# Patient Record
Sex: Female | Born: 1975
Health system: Southern US, Community
[De-identification: ages and names within clinical notes are randomized; demographics above are authoritative.]

## PROBLEM LIST (undated history)

## (undated) DIAGNOSIS — R928 Other abnormal and inconclusive findings on diagnostic imaging of breast: Secondary | ICD-10-CM

## (undated) DIAGNOSIS — F419 Anxiety disorder, unspecified: Secondary | ICD-10-CM

## (undated) DIAGNOSIS — J45909 Unspecified asthma, uncomplicated: Secondary | ICD-10-CM

## (undated) DIAGNOSIS — I471 Supraventricular tachycardia, unspecified: Secondary | ICD-10-CM

## (undated) DIAGNOSIS — L03019 Cellulitis of unspecified finger: Secondary | ICD-10-CM

## (undated) DIAGNOSIS — J301 Allergic rhinitis due to pollen: Secondary | ICD-10-CM

## (undated) HISTORY — PX: TONSILLECTOMY: SUR1361

## (undated) HISTORY — DX: Allergic rhinitis due to pollen: J30.1

## (undated) HISTORY — DX: Anxiety disorder, unspecified: F41.9

## (undated) HISTORY — DX: Supraventricular tachycardia: I47.1

## (undated) HISTORY — DX: Cellulitis of unspecified finger: L03.019

## (undated) HISTORY — DX: Unspecified asthma, uncomplicated: J45.909

## (undated) HISTORY — PX: AUGMENTATION MAMMAPLASTY: SUR837

## (undated) HISTORY — DX: Supraventricular tachycardia, unspecified: I47.10

## (undated) HISTORY — PX: DILATION AND CURETTAGE OF UTERUS: SHX78

## (undated) HISTORY — DX: Other abnormal and inconclusive findings on diagnostic imaging of breast: R92.8

---

## 2002-07-28 ENCOUNTER — Other Ambulatory Visit: Admission: RE | Admit: 2002-07-28 | Discharge: 2002-07-28 | Payer: Self-pay | Admitting: Obstetrics and Gynecology

## 2002-10-28 ENCOUNTER — Inpatient Hospital Stay (HOSPITAL_COMMUNITY): Admission: AD | Admit: 2002-10-28 | Discharge: 2002-10-28 | Payer: Self-pay | Admitting: Obstetrics and Gynecology

## 2002-10-29 ENCOUNTER — Inpatient Hospital Stay (HOSPITAL_COMMUNITY): Admission: AD | Admit: 2002-10-29 | Discharge: 2002-10-29 | Payer: Self-pay | Admitting: Obstetrics and Gynecology

## 2002-12-16 ENCOUNTER — Ambulatory Visit (HOSPITAL_COMMUNITY): Admission: RE | Admit: 2002-12-16 | Discharge: 2002-12-16 | Payer: Self-pay | Admitting: Obstetrics and Gynecology

## 2002-12-16 ENCOUNTER — Encounter: Payer: Self-pay | Admitting: Obstetrics and Gynecology

## 2003-01-04 ENCOUNTER — Encounter (INDEPENDENT_AMBULATORY_CARE_PROVIDER_SITE_OTHER): Payer: Self-pay | Admitting: Specialist

## 2003-01-04 ENCOUNTER — Inpatient Hospital Stay (HOSPITAL_COMMUNITY): Admission: AD | Admit: 2003-01-04 | Discharge: 2003-01-06 | Payer: Self-pay | Admitting: Obstetrics and Gynecology

## 2003-01-18 ENCOUNTER — Ambulatory Visit (HOSPITAL_COMMUNITY): Admission: AD | Admit: 2003-01-18 | Discharge: 2003-01-18 | Payer: Self-pay | Admitting: Obstetrics and Gynecology

## 2004-05-17 ENCOUNTER — Other Ambulatory Visit: Admission: RE | Admit: 2004-05-17 | Discharge: 2004-05-17 | Payer: Self-pay | Admitting: Obstetrics and Gynecology

## 2007-06-22 ENCOUNTER — Emergency Department (HOSPITAL_COMMUNITY): Admission: EM | Admit: 2007-06-22 | Discharge: 2007-06-22 | Payer: Self-pay | Admitting: Emergency Medicine

## 2007-06-24 ENCOUNTER — Emergency Department (HOSPITAL_COMMUNITY): Admission: EM | Admit: 2007-06-24 | Discharge: 2007-06-24 | Payer: Self-pay | Admitting: Emergency Medicine

## 2007-06-26 ENCOUNTER — Ambulatory Visit: Payer: Self-pay | Admitting: Cardiology

## 2007-06-26 LAB — CONVERTED CEMR LAB: TSH: 1.1 microintl units/mL (ref 0.35–5.50)

## 2007-07-02 ENCOUNTER — Encounter: Payer: Self-pay | Admitting: Cardiology

## 2007-07-02 ENCOUNTER — Ambulatory Visit: Payer: Self-pay

## 2007-08-04 ENCOUNTER — Ambulatory Visit: Payer: Self-pay | Admitting: Cardiology

## 2008-07-03 DIAGNOSIS — R002 Palpitations: Secondary | ICD-10-CM | POA: Insufficient documentation

## 2008-07-08 ENCOUNTER — Telehealth: Payer: Self-pay | Admitting: Cardiology

## 2008-07-14 ENCOUNTER — Ambulatory Visit: Payer: Self-pay | Admitting: Cardiology

## 2009-02-24 ENCOUNTER — Telehealth: Payer: Self-pay | Admitting: Cardiology

## 2009-07-21 ENCOUNTER — Ambulatory Visit: Payer: Self-pay | Admitting: Cardiology

## 2010-04-04 NOTE — Assessment & Plan Note (Signed)
Summary: 1 yr rov for palps  fph   Visit Type:  Follow-up  CC:  Palpiations.  History of Present Illness: The patient presents for followup of palpitations. In the past year with her beta blocker she has done well. He is not noticing any palpitations on a regular basis. She is not having any presyncope or syncope. She is not having any chest pressure, neck or arm discomfort.  Once while traveling in [place] she did forget her metoprolol and at night had significant palpitations. She did take her beta blocker and things improved. She's been running and doing other exercises without limitations.  Current Medications (verified): 1)  Metoprolol Succinate 25 Mg Xr24h-Tab (Metoprolol Succinate) .... 2 By Mouth Daily 2)  Claritin 10 Mg Tabs (Loratadine) .... As Needed  Allergies (verified): No Known Drug Allergies  Past History:  Past Medical History: Reviewed history from 07/03/2008 and no changes required. Tachypalpitations  Past Surgical History: Reviewed history from 07/03/2008 and no changes required.  D&C, tonsillectomy.   Review of Systems       As stated in the HPI and negative for all other systems.   Vital Signs:  Patient profile:   35 year old female Height:      62 inches Weight:      108 pounds BMI:     19.82 Pulse rate:   77 / minute Resp:     16 per minute BP sitting:   104 / 62  (right arm)  Vitals Entered By: Marrion Coy, CNA (Jul 21, 2009 3:05 PM)  Physical Exam  General:  Well developed, well nourished, in no acute distress. Head:  normocephalic and atraumatic Neck:  Neck supple, no JVD. No masses, thyromegaly or abnormal cervical nodes. Chest Wall:  no deformities or breast masses noted Lungs:  Clear bilaterally to auscultation and percussion. Heart:  Non-displaced PMI, chest non-tender; regular rate and rhythm, S1, S2 without murmurs, rubs or gallops. Carotid upstroke normal, no bruit. Normal abdominal aortic size, no bruits. Femorals normal  pulses, no bruits. Pedals normal pulses. No edema, no varicosities. Abdomen:  Bowel sounds positive; abdomen soft and non-tender without masses, organomegaly, or hernias noted. No hepatosplenomegaly. Msk:  Back normal, normal gait. Muscle strength and tone normal. Extremities:  No clubbing or cyanosis. Neurologic:  Alert and oriented x 3. Skin:  Intact without lesions or rashes. Psych:  Normal affect.   EKG  Procedure date:  07/21/2009  Findings:      sinus rhythm, rate 77, RSR prime V1 and V2, no acute ST-T wave changes  Impression & Recommendations:  Problem # 1:  PALPITATIONS (ICD-785.1) We again discussed the patient's palpitations. She doesn't have these if she takes her beta blocker. She otherwise is doing well. She's had a complete evaluation including labs and an echocardiogram. No further cardiovascular testing is suggested. She does want yearly followup and I will arrange this. Orders: EKG w/ Interpretation (93000)  Patient Instructions: 1)  Your physician recommends that you schedule a follow-up appointment in: 1 yr with Dr Hurricane Lions 2)  Your physician recommends that you continue on your current medications as directed. Please refer to the Current Medication list given to you today.

## 2010-07-18 NOTE — Consult Note (Signed)
Crystal, Friedman             ACCOUNT NO.:  [id number]   MEDICAL RECORD NO.:  [id number]          PATIENT TYPE:  EMS   LOCATION:  [facility]                         FACILITY:  [facility]   PHYSICIAN:  Vivaan Helseth Dictator       DATE OF BIRTH:  06/02/1975   DATE OF CONSULTATION:  06/24/2007  DATE OF DISCHARGE:                                 CONSULTATION   PRIMARY CARE:  Is Dr. Daphane Shepherd with [facility].   No cardiologist at this time.   Crystal Friedman presents to [facility] emergency room on this date by EMS  complaining of palpitations with chest pain.  She is a very pleasant, 35-  year-old, Caucasian female with no known medical history who had an  episode of palpitations on Sunday and was actually seen at [facility].  She was started on Toprol XL 25 mg for supraventricular tachycardia and  apparently was arranged for an appointment at our office for April 30.  Since Sunday, the patient has had four more episodes of rapid heart rate  associated with chest discomfort.  She states it feels like an elephant  on her chest and a tight band wrapped around her chest, and it makes her  very anxious.  She is taking her Toprol as prescribed.  She has tried  Valsalva maneuver without success.  No caffeine.  Has not taken any over-  the-counter medications.  The only medication she is on is Claritin, and  it is the Claritin without the decongestant.  Today, she experienced  palpitations again, was associated with presyncope.  EMS was called.  First responders documented her heart rate as 168.  EMS 12-lead EKG  showing sinus tachycardia.  By the time the patient was on the heart  monitor, the rate has slowed down.  Crystal Friedman denies any chest  discomfort currently.  However, her heart rate is around 100-110 at  rest.  Here in the ER, she was given 2 mg of Ativan, 2 mg of morphine,  and 4 mg of Zofran on initial arrival because of anxiety associated with  her rapid heart rate.  She is currently very groggy secondary  to the  sedation.   PAST MEDICAL HISTORY:  Includes seasonal allergies and x2 normal  pregnancies.   No known drug allergies.   MEDICATION:  Include plain Claritin.   SOCIAL HISTORY:  The patient lives in [place] with her husband and two  children.  She is very active.  Denies any drug or herbal medication  use.  No tobacco use.  States she has an occasional glass of wine maybe  four or five times a month.  Parents are alive and well without any  medical problems.   REVIEW OF SYSTEMS:  Positive for palpitations and chest discomfort as  described above.  Otherwise systems reviewed and negative.   PHYSICAL EXAMINATION:  The patient afebrile, blood pressure 102/64,  heart rate 96-110 currently, respirations 16.  The patient is very  groggy but in no acute distress.  HEENT:  Normal.  NECK:  Supple without lymphadenopathy, no bruits.  No JVD.  CARDIOVASCULAR EXAM:  Reveals  S1-S2, slightly tachycardic.  No murmurs,  rubs or gallops.  LUNGS:  Clear to auscultation bilaterally.  ABDOMEN:  Soft, nontender, positive bowel sounds.  SKIN:  Is warm and dry.  LOWER EXTREMITIES:  Without clubbing, cyanosis or edema.  NEUROLOGICALLY:  Oriented x3, groggy but appropriate.   Chest x-ray showing no evidence of acute cardiopulmonary disease.  Point  of cares:  Negative troponin x2.  Sodium 141, potassium 3.7, chloride  106, BUN 20, creatinine 0.9, hemoglobin 12.6, hematocrit 37.  Urine  pregnancy negative.  D-dimer 0.25.   IMPRESSION:  Palpitations with rapid heart rate, undocumented at this  time.  However, patient is sinus tachycardia at a rate of 100-110 on  telemetry here in the emergency room.  The patient with a previously  scheduled appointment at our office for April 30; will be rearranged to  be seen this week, also arrange for a Holter monitor to be placed on the  patient, and Toprol has been increased to 50 mg daily.  I have given her  a prescription for Ativan 0.5 mg 1 p.o. q.8h.  p.r.n.  The patient will  be called at home at [phone] by office staff tomorrow to have  appointment moved up and Holter monitor arranged.      Dorian Pod, ACNP    ______________________________  Merland Holness Dictator    MB/MEDQ  D:  06/24/2007  T:  06/24/2007  Job:  [id number]

## 2010-07-18 NOTE — Assessment & Plan Note (Signed)
[facility]                            CARDIOLOGY OFFICE NOTE   Crystal Friedman, Crystal Friedman                      MRN:          [id number]  DATE:06/26/2007                            DOB:          26-Jul-1975    PRIMARY:  None.   REASON FOR PRESENTATION:  Evaluate patient for tachycardia.   HISTORY OF PRESENT ILLNESS:  The patient presents for follow-up after  being seen by our group in consultation.  She had, had some tachy  palpitations.  She says she is actually been having these for several  weeks.  She thinks that she may have had about 15 or 20 episodes over  several weeks.  She had a severe episode on Sunday at rest.  It lasted  about 45 minutes.  It was at night.  She gets nauseated.  She has been  lightheaded with these in the past.  She did present to Urgent Care,  where apparently her heart rate was 182, but there was no EKG documented  that picked up this arrhythmia.  She was transferred to [facility].  She  was given some beta-blockers.  However, she had recurrence of this.  At  that time, she was taking 25 mg XL of Toprol.  She came to the ER on  Tuesday, where she was seen in consultation.  Interestingly, EMS had  documented her heart rate to be 168.  However, the first on the EKG  could be obtained, her heart rate was down in the 100-110 range and was  sinus tachycardia.  She was told to get an increased dose of her  metoprolol and has had no severe symptomatic palpitations since that  time.  She was also told about vagal maneuvers, but has not had to use  these.  She was told to follow-up in his office.   She has otherwise been a healthy 35 year old.  She denies any chest  pressure, neck or arm discomfort.  She has had no PND, orthopnea or  shortness of breath.  She does drink a little caffeine.  She does not  smoke cigarettes or drink alcohol.   PAST MEDICAL HISTORY:  Seasonal allergies, two normal pregnancies.  D&C,  tonsillectomy.   ALLERGIES:  NONE.   MEDICATIONS:  Metoprolol 50 mg daily, Bufferin, loratadine.   REVIEW OF SYSTEMS:  As stated in the HPI.  Positive for occasional  headaches, reflux, mild low back pain.  Negative for other systems.   PHYSICAL EXAMINATION:  GENERAL:  The patient is in no distress.  VITAL SIGNS:  Blood pressure 112/76, heart rate 96 and regular, weight  102 pounds.  HEENT:  Eyes unremarkable.  Pupils equal, round and reactive to light.  Fundi not visualized.  Oral mucosa unremarkable.  NECK:  No jugulovenous distention at 45 degrees.  Carotid upstroke brisk  and symmetrical.  No bruits, no thyromegaly.  LYMPHATICS:  No cervical, axillary or inguinal adenopathy.  LUNGS:  Clear to auscultation bilaterally.  BACK:  No costovertebral angle tenderness.  CHEST:  Unremarkable.  HEART:  PMI not displaced or sustained, S1-S2 within normal limits.  No  S3, no S4.  No clicks, rubs or murmurs.  ABDOMEN:  Flat, positive bowel sounds.  Normal in frequency and pitch.  No bruits, rebound or guarding.  No midline pulsatile mass.  No  hepatomegaly or splenomegaly.  SKIN:  No rashes, no nodules.  EXTREMITIES:  2+ pulses throughout.  No edema, cyanosis or clubbing.  NEURO:  Oriented to person, place and time.  Cranial nerves II-XII  grossly intact.  Motor grossly intact.   EKG sinus rhythm, rate 96, axis within normal limits.  Intervals were  normal.  No acute ST wave change.   ASSESSMENT/PLAN:  1. Tachy palpitations.  The patient sounds like she had a sustained      dysrhythmia.  This was not captured on a 12-lead or rhythm strip.      She has a normal physical examination.  I do not see a TSH.  We      will draw this.  I will have her on a 4-week event monitor.  She      will continue the beta-blocker.  If she is no more severe episodes,      then this will be the continued therapy.  Otherwise, we will look      for the mechanism and address how to treat this.  I will also check      an  echocardiogram.  2. Follow-up will be in 1 month or sooner if needed.  Of note, if the      patient has any sustained tachy palpitations, she is to call EMS.     Rollene Rotunda, MD, [facility]  Electronically Signed    JH/MedQ  DD: 06/26/2007  DT: 06/26/2007  Job #: [id number]

## 2010-07-18 NOTE — Assessment & Plan Note (Signed)
[facility]                            CARDIOLOGY OFFICE NOTE   Crystal Friedman, Crystal Friedman                      MRN:          [id number]  DATE:08/04/2007                            DOB:          Feb 24, 1976    PRIMARY:  None.   REASON FOR PRESENTATION:  Evaluate patient with palpitations.   HISTORY OF PRESENT ILLNESS:  The patient presents for follow-up of the  above.  She is 35 years old.  Her symptoms are outlined in the June 26, 2007, note.  She wore a 21-day event monitor.  She pressed the button  many times.  There were some rare premature atrial contractions but for  almost the entire part she had normal sinus rhythm when she would press  the monitor.  She has been taking a beta blocker.  She was fatigued with  this at first.  However, she persisted in taking it.  She says she is no  longer having any palpitations.  She says she feels well.  She feels  back to her normal self.  She has had no presyncope or syncope.  She has  had no chest pain or shortness of breath.   PAST MEDICAL HISTORY:  D&C, tonsillectomy.   ALLERGIES:  None.   MEDICATIONS:  Metoprolol 50 mg daily, Bufferin, loratadine.   REVIEW OF SYSTEMS:  As stated in HPI and otherwise negative for other  systems.   PHYSICAL EXAMINATION:  The patient is well-appearing and in no distress.  Blood pressure 100/70, heart rate 75 and regular, weight 102 pounds.  NECK:  No jugular distention at 45 degrees, carotid upstroke brisk and  symmetric.  No bruits, no thyromegaly.  LUNGS:  Clear to auscultation bilaterally.  HEART:  PMI not displaced or sustained.  S1 and S2 within normal limits.  No S3, no S4.  No clicks, rubs or murmurs.  ABDOMEN:  Flat, positive bowel sounds.  No rebound, no guarding.  EXTREMITIES:  2+ pulse, no edema.   EKG:  Sinus rhythm, rate 75, axis within normal limits, intervals within  normal limits, no acute ST-wave change.   ASSESSMENT AND PLAN:  1.  Tachypalpitations.  The patient is no longer having these symptoms.      She has done well with the beta blocker.  She prefers to continue      this.  She has had a normal echocardiogram.  She has had normal      TSH.  No further cardiac workup is planned at this      point.  She will call me if she has any recurrent symptoms.  2. Follow up as above.     Rollene Rotunda, MD, [facility]  Electronically Signed    JH/MedQ  DD: 08/04/2007  DT: 08/04/2007  Job #: [id number]

## 2010-07-21 NOTE — H&P (Signed)
NAMEJEWELZ, RICKLEFS NO.:  [id number]   MEDICAL RECORD NO.:  [id number]                   PATIENT TYPE:  MAT   LOCATION:  [facility]                                 FACILITY:  [facility]   PHYSICIAN:  Hal Morales, M.D.             DATE OF BIRTH:  11/18/1975   DATE OF ADMISSION:  01/18/2003  DATE OF DISCHARGE:                                HISTORY & PHYSICAL   HISTORY OF PRESENT ILLNESS:  [name] is a 35 year old, gravida 2, para 2-  0-0-2, who presents to the office at two weeks postpartum from a vaginal  delivery with an episode of heavy bleeding at home earlier today, followed  by the passage of questionable pieces of placenta.  Her vaginal delivery,  which took place on January 04, 2003, was essentially unremarkable, although  there was a velamentous insertion of the umbilical cord noted after the  placenta was delivered.  This was sent to pathology, and the only thing  abnormal noted on pathology was that they were incomplete membranes.   OBSTETRICAL HISTORY:  The patient is a gravida 2, para 2-0-0-2.  In August  of 2002, she vaginally delivered a female infant after five hours of labor.  He weighed 5 pounds.  This was at [redacted] weeks gestation.  She had an epidural  for anesthesia.  Her second delivery took place on January 04, 2003.  The  infant weighed 6 pounds and 3 ounces.  It was a viable female, and there are  no complications.   MEDICAL HISTORY:  She reports having had the usual childhood illnesses.  She  has a history of asthma, for which she has used an inhaler as needed.  She  has had cystitis one time.   ALLERGIES:  She has no medication allergies.   SURGICAL HISTORY:  Remarkable for a tonsillectomy at the age of [age].   GENETIC HISTORY:  Unremarkable.   SOCIAL HISTORY:  The patient is married to the father of the baby.  His name  is Judie Grieve.  He is involved and supportive, and they deny any alcohol,  tobacco, or illicit drug use with the  pregnancy.   OBJECTIVE DATA:  VITAL SIGNS:  Stable.  Temperature was 97.5, blood pressure  100/60.  Her hemoglobin was 10.6.  HEENT:  Grossly within normal limits.  CHEST:  Clear to auscultation.  HEART:  Regular rate and rhythm.  ABDOMEN:  Soft and nontender.  Uterus is at approximately 14 weeks size and  firm.  PELVIC:  Speculum exam shows membranes present at the cervical os.  Adnexa  are nontender.  EXTREMITIES:  Within normal limits.   Ultrasound is consistent with retained products.    ASSESSMENT:  1. Status post vaginal delivery two weeks ago.  2. Retained products of conception.   PLAN:  Send patient to [facility] for a D&C per Dr.  Dierdre Forth.  Cam Hai, C.N.M.                     Hal Morales, M.D.    KS/MEDQ  D:  01/18/2003  T:  01/18/2003  Job:  [id number]

## 2010-07-21 NOTE — H&P (Signed)
NAMELEILANNY, FLUITT                         ACCOUNT NO.:  [id number]   MEDICAL RECORD NO.:  [id number]                   PATIENT TYPE:  INP   LOCATION:  9168                                 FACILITY:  [facility]   PHYSICIAN:  Hal Morales, M.D.             DATE OF BIRTH:  23-Jun-1975   DATE OF ADMISSION:  01/04/2003  DATE OF DISCHARGE:                                HISTORY & PHYSICAL   HISTORY OF PRESENT ILLNESS:  Crystal Friedman is a 35 year old gravida 2, para 1-0-  0-1, who presents at 39-5/7 weeks in active labor.  EDD January 06, 2003, as  determined by LMP dates and confirmed by 7-3/7 week ultrasound.  She has  been contracting throughout the night.  These contractions have been  increasing in frequency and intensity.  They are now one to three minutes  apart and the patient is noted to be 6 cm dilated.  She reports positive  fetal movement, no bleeding, no rupture of membranes, no PIH symptoms, no  headache, visual changes, or epigastric pain.  She is scheduled for  induction of labor today secondary to term pregnancy with favorable cervix,  but has been admitted in active labor.  Her pregnancy has been followed by  the M.D. service at [facility] and is remarkable for transfer of care from  [place] in early pregnancy at 16 weeks, history of SGA, questionable IUGR  with previous pregnancy, decreased BMI with low pregnancy weight gain of 15  pounds.  The patient began pregnancy at 98 pounds, and last pregnancy weight  was 113 pounds, history of asthma, Group B Strep negative.  This patient  presented for care at [facility] at 16-6/7 weeks as transfer from [place].  Her  pregnancy has been significant for decreased BMI, size less than dates, and  minimal pregnancy weight gain.  She has had serial ultrasound evaluations  secondary to the above.  All ultrasound examinations have shown a normally  grown fetus with normal fluid.  The last ultrasound on prenatal records  showing at 34 weeks an  average gestational age at 51%.  She has been  normotensive throughout her pregnancy with no proteinuria.  At 30 weeks she  began to experience preterm contractions and was worked up for preterm  labor.  Fetal fibronectin remains negative.  She was given betamethasone  series of two shots and remained on bed rest with preterm labor precautions  until 36 weeks.  Prenatal lab work on Jul 28, 2002, hemoglobin and  hematocrit 11.7 and 35.2, platelets 208,000.  Blood type and Rh O positive.  Antibody screen negative.  VDRL nonreactive.  Rubella immune.  Hepatitis B  surface antigen negative.  HIV nonreactive.  Pap smear within normal limits,  showing Candida.  Gonorrhea and Chlamydia negative.  CF testing negative.  QUAD screen within normal limits at 28 weeks.  One hour glucose challenge  110, and hemoglobin 9.9.  At 26  weeks, culture of the vaginal tract is  negative for Group B Strep.   OBSTETRICAL HISTORY:  In 2002, the patient had a normal spontaneous vaginal  delivery.  She was induced at 37 weeks for STA, questionable IUTR, with a  birth of a 5 pound female infant named Ladona Ridgel, and for the current pregnancy.   PAST MEDICAL HISTORY:  1. Significant for decreased BMI.  2. Asthma.  3. She had a tonsillectomy at age 48 or 51.   FAMILY HISTORY:  Maternal grandmother and maternal grandfather with a  history of chronic hypertension.  The patient's father with a history of  diabetes.   GENETIC HISTORY:  Unremarkable.  There is no family history of chromosomal  or genetic disorders, children that died in infancy, or that with born with  birth defects.   ALLERGIES:  No known drug allergies.   HABITS:  Denies the use of tobacco, alcohol, or illicit drugs.   SOCIAL HISTORY:  Ms. Montini is a 35 year old Caucasian female.  She is  married to [employer] who works as an Art gallery manager.  The patient is a  homemaker.  They do not subscribe to a religious faith.   REVIEW OF SYSTEMS:  As described  above.  The patient is typical one with the  uterine pregnancy at term, in active labor.   PHYSICAL EXAMINATION:  VITAL SIGNS:  Stable, afebrile.  HEENT:  Unremarkable.  HEART:  Regular rate and rhythm.  LUNGS:  Clear.  ABDOMEN:  Gravid in its contour.  Uterine fundus is noted to extend 37 cm  above the level of the pubic symphysis.  Leopold maneuver finds the infant  to be in a longitudinal lay, cephalic presentation, and the estimated fetal  weight is 6 to 6.5 pounds.  The baseline of the fetal heart rate is 140s  with positive variability, positive accelerations, no decelerations noted.  The patient is contracting every one to three minutes.  Digital exam of the  cervix finds it to be 6 cm dilated, 90% effaced, with the cephalic  presenting part at a -1 station, and membranes intact.  EXTREMITIES:  No pathologic edema.  DTRs are 1+ with no clonus.   ASSESSMENT:  1. Intrauterine pregnancy at 39-5/7 weeks.  2. Active labor.   PLAN:  Admit per Dr. Dierdre Forth.  Routine M.D. orders.  The patient may  have epidural.     Rica Koyanagi, C.N.M.               Hal Morales, M.D.    SDM/MEDQ  D:  01/04/2003  T:  01/04/2003  Job:  [id number]

## 2010-07-21 NOTE — Op Note (Signed)
Crystal Friedman, Crystal Friedman                         ACCOUNT NO.:  [id number]   MEDICAL RECORD NO.:  [id number]                   PATIENT TYPE:  MAT   LOCATION:  [facility]                                 FACILITY:  [facility]   PHYSICIAN:  Hal Morales, M.D.             DATE OF BIRTH:  10-03-1975   DATE OF PROCEDURE:  01/18/2003  DATE OF DISCHARGE:                                 OPERATIVE REPORT   PREOPERATIVE DIAGNOSIS:  Postpartum hemorrhage and retained products of  conception.   POSTOPERATIVE DIAGNOSIS:  Postpartum hemorrhage and retained products of  conception.   OPERATION:  Suction dilatation and evacuation.   ANESTHESIA:  Monitored anesthesia care and local.   ESTIMATED BLOOD LOSS:  Less than 100 mL.   COMPLICATIONS:  None.   FINDINGS:  The uterus was enlarged to approximately 10 weeks' size and  sounded 7 cm.  There was a small amount of products of conception and  membranes obtained at suction evacuation.   PROCEDURE:  The patient was taken to the operating room after appropriate  identification and placed on the operating table.  After placement of  equipment for monitored anesthesia care, she was placed in the lithotomy  position.  The perineum and vagina were prepped with multiple layers of  Betadine.  A red Robinson catheter was used to empty the bladder.  The  perineum was draped as a sterile field.  A Graves speculum was placed in the  posterior vagina and a single-tooth tenaculum placed on the anterior cervix.  A paracervical block was achieved with a total of 10 mL of 2% Xylocaine at  the 5 and 7 o'clock positions.  The cervix was noted to be open with  membranes trailing from the cervical os.  These were removed with a ring  forcep.  A #10 suction catheter was then used to suction evacuate all  quadrants of the uterus with a small amount of products of conception  obtained.  Hemostasis was noted to be adequate.  All instruments were then  removed from the vagina,  and the patient was taken from the operating room  to the recovery room in satisfactory condition, having tolerated the  procedure well with sponge and instrument counts correct.  Specimen to  pathology and products of conception and membranes.   DISCHARGE INSTRUCTIONS:  Printed instructions from the [facility] for  [facility].   DISCHARGE MEDICATIONS:  Ibuprofen 600 mg p.o. q. 6 hours p.r.n. pain.  Ancef  1 gm IV was given in the postanesthesia care unit.  The patient is given  Methergine 0.2 mg IM in the postanesthesia care unit and then Methergine 0.2  mg p.o. q. 6 hours for 24 hours.   She will follow up in two weeks for postoperative evaluation.  Hal Morales, M.D.   VPH/MEDQ  D:  01/18/2003  T:  01/18/2003  Job:  [id number]

## 2010-08-11 ENCOUNTER — Other Ambulatory Visit: Payer: Self-pay | Admitting: Cardiology

## 2010-11-28 LAB — PREGNANCY, URINE: Preg Test, Ur: NEGATIVE

## 2010-11-28 LAB — CBC
HCT: 37.1
Hemoglobin: 12.6
MCV: 89.4
RBC: 4.15
WBC: 6.1

## 2010-11-28 LAB — POCT I-STAT, CHEM 8
Calcium, Ion: 1.11 — ABNORMAL LOW
Chloride: 106
HCT: 37
Potassium: 3.7

## 2010-11-28 LAB — POCT CARDIAC MARKERS
CKMB, poc: <1 — ABNORMAL LOW
Operator id: 294521
Troponin i, poc: <0.05
Troponin i, poc: <0.05

## 2010-11-28 LAB — URINALYSIS, ROUTINE W REFLEX MICROSCOPIC
Bilirubin Urine: NEGATIVE
Glucose, UA: NEGATIVE
Ketones, ur: 15 — AB
pH: 5.5

## 2010-11-28 LAB — D-DIMER, QUANTITATIVE: D-Dimer, Quant: 0.25

## 2010-12-08 ENCOUNTER — Other Ambulatory Visit: Payer: Self-pay | Admitting: Orthopedic Surgery

## 2010-12-08 ENCOUNTER — Ambulatory Visit
Admission: RE | Admit: 2010-12-08 | Discharge: 2010-12-08 | Disposition: A | Discharge status: Home / Self Care | Payer: BC Managed Care – PPO | Source: Ambulatory Visit | Attending: Orthopedic Surgery | Admitting: Orthopedic Surgery

## 2010-12-08 DIAGNOSIS — R52 Pain, unspecified: Secondary | ICD-10-CM

## 2011-06-12 ENCOUNTER — Other Ambulatory Visit: Payer: Self-pay | Admitting: *Deleted

## 2011-06-12 MED ORDER — METOPROLOL SUCCINATE ER 25 MG PO TB24: 25.0000 mg | ORAL_TABLET | Freq: Every day | ORAL | Status: DC

## 2011-06-18 ENCOUNTER — Telehealth: Payer: Self-pay | Admitting: Cardiology

## 2011-06-18 MED ORDER — METOPROLOL SUCCINATE ER 25 MG PO TB24: ORAL_TABLET | ORAL | Status: DC

## 2011-06-18 NOTE — Telephone Encounter (Signed)
New msg Pt was calling verify correct metoprolol dosage. She said she is taking two per day. When she got last refill thru [facility] mail it says one per day. Please call

## 2011-06-18 NOTE — Telephone Encounter (Signed)
Spoke with pt, according to the last office note, the pt will cont with the metoprolol succ 25 mg two tablets daily.

## 2011-07-10 ENCOUNTER — Ambulatory Visit (INDEPENDENT_AMBULATORY_CARE_PROVIDER_SITE_OTHER): Payer: BC Managed Care – PPO | Admitting: Cardiology

## 2011-07-10 ENCOUNTER — Encounter: Payer: Self-pay | Admitting: Cardiology

## 2011-07-10 VITALS — BP 110/80 | HR 65 | Ht 62.0 in | Wt 105.4 lb

## 2011-07-10 DIAGNOSIS — R002 Palpitations: Secondary | ICD-10-CM

## 2011-07-10 NOTE — Patient Instructions (Signed)
The current medical regimen is effective;  continue present plan and medications.  Follow up as needed 

## 2011-07-10 NOTE — Assessment & Plan Note (Signed)
The patient is doing well with the beta blocker.  She will continue with this medication. Otherwise no change in therapy or further evaluation is indicated.

## 2011-07-10 NOTE — Progress Notes (Signed)
   HPI The patient presents for follow up of palpitations. Since I ast saw her she has done well. At one point she did run out of her  beta blocker and she thought her palpitations returned.  With this medication she feels well. She denies any chest pressure, neck or arm discomfort. She denies any shortness of breath, PND or orthopnea. She's active and exercises. She gets no weight gain or edema.  Not on File  Current Outpatient Prescriptions  Medication Sig Dispense Refill  . loratadine (CLARITIN) 10 MG tablet Take 10 mg by mouth daily.      . metoprolol succinate (TOPROL-XL) 25 MG 24 hr tablet Take two tablets daily  180 tablet  3    Past Medical History  Diagnosis Date  . Arrhythmia     tachypalpiations    Past Surgical History  Procedure Date  . Dilation and curettage of uterus   . Tonsillectomy     ROS:  As stated in the HPI and negative for all other systems.  PHYSICAL EXAM BP 110/80  Pulse 65  Ht 5\' 2"  (1.575 m)  Wt 105 lb 6.4 oz (47.809 kg)  BMI 19.28 kg/m2 GENERAL:  Well appearing HEENT:  Pupils equal round and reactive, fundi not visualized, oral mucosa unremarkable NECK:  No jugular venous distention, waveform within normal limits, carotid upstroke brisk and symmetric, no bruits, no thyromegaly LUNGS:  Clear to auscultation bilaterally BACK:  No CVA tenderness CHEST:  Unremarkable HEART:  PMI not displaced or sustained,S1 and S2 within normal limits, no S3, no S4, no clicks, no rubs, no murmurs ABD:  Flat, positive bowel sounds normal in frequency in pitch, no bruits, no rebound, no guarding, no midline pulsatile mass, no hepatomegaly, no splenomegaly EXT:  2 plus pulses throughout, no edema, no cyanosis no clubbing   EKG: Sinus rhythm, rate 65, axis within normal limits, intervals within normal limits, no acute ST-T wave changes.   ASSESSMENT AND PLAN

## 2012-01-25 ENCOUNTER — Ambulatory Visit (INDEPENDENT_AMBULATORY_CARE_PROVIDER_SITE_OTHER): Payer: BC Managed Care – PPO | Admitting: Obstetrics and Gynecology

## 2012-01-25 ENCOUNTER — Encounter: Payer: Self-pay | Admitting: Obstetrics and Gynecology

## 2012-01-25 VITALS — BP 100/62 | HR 68 | Wt 111.0 lb

## 2012-01-25 DIAGNOSIS — Z124 Encounter for screening for malignant neoplasm of cervix: Secondary | ICD-10-CM

## 2012-01-25 DIAGNOSIS — Z01419 Encounter for gynecological examination (general) (routine) without abnormal findings: Secondary | ICD-10-CM

## 2012-01-25 MED ORDER — NAPROXEN 500 MG PO TBEC: 500.0000 mg | DELAYED_RELEASE_TABLET | Freq: Two times a day (BID) | ORAL | Status: DC

## 2012-01-25 NOTE — Progress Notes (Signed)
Subjective:    Crystal Friedman is a 36 y.o. female, G2P0, who presents for an annual exam. The patient reports no complaints.  Menstrual cycle:   LMP: Patient's last menstrual period was 12/26/2011. Flow 3-5 days with pad change 3-5 times a day with 7/10 cramps relieved with NSAIDs             Review of Systems Pertinent items are noted in HPI. Denies pelvic pain, urinary tract symptoms, vaginitis symptoms, irregular bleeding, menopausal symptoms, change in bowel habits or rectal bleeding   Objective:    BP 100/62  Pulse 68  Wt 111 lb (50.349 kg)  LMP 12/26/2011   Wt Readings from Last 1 Encounters:  01/25/12 111 lb (50.349 kg)   There is no height on file to calculate BMI. General Appearance: Alert, no acute distress HEENT: Grossly normal Neck / Thyroid: Supple, no thyromegaly or cervical adenopathy Lungs: Clear to auscultation bilaterally Back: No CVA tenderness Breast Exam: No masses or nodes.No dimpling, nipple retraction or discharge. Implants Cardiovascular: Regular rate and rhythm.  Gastrointestinal: Soft, non-tender, no masses or organomegaly Pelvic Exam: EGBUS-wnl, vagina-normal rugae, cervix- without lesions or tenderness, uterus appears normal size shape and consistency, adnexae-no masses or tenderness Lymphatic Exam: Non-palpable nodes in neck, clavicular,  axillary, or inguinal regions  Skin: no rashes or abnormalities Extremities: no clubbing cyanosis or edema  Neurologic: grossly normal Psychiatric: Alert and oriented  Assessment:   Routine GYN Exam Dysmenorrhea   Plan:  Naproxen 550 # 60 1 po pc bid prn-cramps 3 refills  PAP sent  RTO 1 year or prn  Alima Naser,ELMIRAPA-C

## 2012-01-25 NOTE — Progress Notes (Signed)
Regular Periods: yes Mammogram: no  Monthly Breast Ex.: no Exercise: yes  Tetanus < 10 years: yes Seatbelts: yes  NI. Bladder Functn.: yes Abuse at home: no  Daily BM's: yes Stressful Work: no  Healthy Diet: yes Sigmoid-Colonoscopy: NO  Calcium: no Medical problems this year: NONE   LAST PAP:5/11  Contraception: NONE  Mammogram:  NO  PCP: DR. MYERS  PMH: NO CHANGE  FMH: NO CHANGE  Last Bone Scan: NO  PT IS MARRIED.

## 2012-01-28 LAB — PAP IG W/ RFLX HPV ASCU

## 2012-06-12 ENCOUNTER — Encounter: Payer: Self-pay | Admitting: Family Medicine

## 2012-06-12 ENCOUNTER — Ambulatory Visit (INDEPENDENT_AMBULATORY_CARE_PROVIDER_SITE_OTHER): Payer: BC Managed Care – PPO | Admitting: Family Medicine

## 2012-06-12 VITALS — BP 100/66 | HR 71 | Temp 99.4°F | Ht 60.75 in | Wt 107.0 lb

## 2012-06-12 DIAGNOSIS — Z0000 Encounter for general adult medical examination without abnormal findings: Secondary | ICD-10-CM | POA: Insufficient documentation

## 2012-06-12 DIAGNOSIS — L219 Seborrheic dermatitis, unspecified: Secondary | ICD-10-CM

## 2012-06-12 DIAGNOSIS — L218 Other seborrheic dermatitis: Secondary | ICD-10-CM

## 2012-06-12 DIAGNOSIS — R002 Palpitations: Secondary | ICD-10-CM

## 2012-06-12 MED ORDER — KETOCONAZOLE 2 % EX SHAM: MEDICATED_SHAMPOO | CUTANEOUS | Status: DC

## 2012-06-12 NOTE — Progress Notes (Signed)
Office Note 06/12/2012  CC:  Chief Complaint  Patient presents with  . Establish Care    needs CPE (no GYN exam), transfer from [place]    HPI:  Crystal Friedman is a 37 y.o. White female who is here to establish care and needs CPE. Patient's most recent primary MD: Brooks Sailors in [place]   GYN MD is Dr. Estanislado Pandy and she is UTD with exam/pap. Old records were reviewed ([facility] EMR) prior to or during today's visit.  No acute complaints except about 8 mo hx of itchy scalp rash on back of head.  Has tried several OTC shampoos for dandruff with no effect.  Has cardiology f/u 07/2012 (Dr. Antoine Poche), and he wanted her to get a CPE with fasting labs prior to that f/u.  Past Medical History  Diagnosis Date  . Arrhythmia     tachypalpiations  . Asthma   . SVT (supraventricular tachycardia)   . Hay fever     Past Surgical History  Procedure Laterality Date  . Dilation and curettage of uterus    . Tonsillectomy      Family History  Problem Relation Age of Onset  . Diabetes Father   . Hypertension Maternal Grandmother   . Hypertension Maternal Grandfather     History   Social History  . Marital Status: Married    Spouse Name: N/A    Number of Children: N/A  . Years of Education: N/A   Occupational History  . Not on file.   Social History Main Topics  . Smoking status: Never Smoker   . Smokeless tobacco: Never Used  . Alcohol Use: Yes  . Drug Use: No  . Sexually Active: Yes    Birth Control/ Protection: None   Other Topics Concern  . Not on file   Social History Narrative   Married, 2 kids (11 yr son, 35 y/o daughter).   Stay at home mom.     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   Has lived in [place] since 2004.   No T/A/Ds.   Exercise: walks 2-3 miles per day, runs some, P90 X.   Regular american diet.    Outpatient Encounter Prescriptions as of 06/12/2012  Medication Sig Dispense Refill  . metoprolol succinate (TOPROL-XL) 25 MG 24 hr tablet Take two  tablets daily  180 tablet  3  . ketoconazole (NIZORAL) 2 % shampoo Apply topically 2 (two) times a week. Apply to affected area of scalp daily  120 mL  3  . [DISCONTINUED] loratadine (CLARITIN) 10 MG tablet Take 10 mg by mouth daily.      . [DISCONTINUED] naproxen (EC NAPROSYN) 500 MG EC tablet Take 1 tablet (500 mg total) by mouth 2 (two) times daily with a meal.  60 tablet  3   No facility-administered encounter medications on file as of 06/12/2012.    No Known Allergies  ROS Review of Systems  Constitutional: Negative for fever, chills, appetite change and fatigue.  HENT: Negative for ear pain, congestion, sore throat, neck stiffness and dental problem.   Eyes: Negative for discharge, redness and visual disturbance.  Respiratory: Negative for cough, chest tightness, shortness of breath and wheezing.   Cardiovascular: Positive for palpitations. Negative for chest pain and leg swelling.  Gastrointestinal: Negative for nausea, vomiting, abdominal pain, diarrhea and blood in stool.  Genitourinary: Negative for dysuria, urgency, frequency, hematuria, flank pain and difficulty urinating.  Musculoskeletal: Negative for myalgias, back pain, joint swelling and arthralgias.  Skin:  Positive for rash (scalp). Negative for pallor.  Neurological: Negative for dizziness, speech difficulty, weakness and headaches.  Hematological: Negative for adenopathy. Does not bruise/bleed easily.  Psychiatric/Behavioral: Negative for confusion and sleep disturbance. The patient is not nervous/anxious.     PE; Blood pressure 100/66, pulse 71, temperature 99.4 F (37.4 C), temperature source Temporal, height 5' 0.75" (1.543 m), weight 107 lb (48.535 kg), SpO2 96.00%. Pt examined with CMA Francee Piccolo in room. Gen: Alert, well appearing.  Patient is oriented to person, place, time, and situation. AFFECT: pleasant, lucid thought and speech. ENT: Scalp: occipital region with some scattered patches of  hyperkeratotic, flaky, papular rash-light pink but not erythematous.  No large scales or salmon colored flakes/scales. Ears: EACs clear, normal epithelium.  TMs with good light reflex and landmarks bilaterally.  Eyes: no injection, icteris, swelling, or exudate.  EOMI, PERRLA. Nose: no drainage or turbinate edema/swelling.  No injection or focal lesion.  Mouth: lips without lesion/swelling.  Oral mucosa pink and moist.  Dentition intact and without obvious caries or gingival swelling.  Oropharynx without erythema, exudate, or swelling.  Neck: supple/nontender.  No LAD, mass, or TM.  Carotid pulses 2+ bilaterally, without bruits. CV: RRR, no m/r/g.   LUNGS: CTA bilat, nonlabored resps, good aeration in all lung fields. ABD: soft, NT, ND, BS normal.  No hepatospenomegaly or mass.  No bruits. EXT: no clubbing, cyanosis, or edema.  Musculoskeletal: no joint swelling, erythema, warmth, or tenderness.  ROM of all joints intact. Skin - no sores or suspicious lesions or rashes or color changes Neuro: CN 2-12 intact bilaterally, strength 5/5 in proximal and distal upper extremities and lower extremities bilaterally.  No sensory deficits.  No tremor.  No disdiadochokinesis.  No ataxia.  Upper extremity and lower extremity DTRs symmetric.  No pronator drift.   Pertinent labs:  none  ASSESSMENT AND PLAN:   New pt: obtain old records.  Health maintenance examination Reviewed age and gender appropriate health maintenance issues (prudent diet, regular exercise, health risks of tobacco and excessive alcohol, use of seatbelts, fire alarms in home, use of sunscreen).  Also reviewed age and gender appropriate health screening as well as vaccine recommendations. Pt reports Tdap UTD--will get old records to confirm/document. She'll return at her earliest convenience for fasting CBC, CMET, TSH, and lipid panel.  Seborrheic dermatitis of scalp Ketoconazole 2% shampoo nightly until much improved, then use qod and  then prn after that.  PALPITATIONS Hx of SVT. Has f/u arranged with Dr. Antoine Poche next month. Continue Toprol XL 50mg  qhs.   An After Visit Summary was printed and given to the patient.  Return for return at your earliest convenience for fasting labs (ordered).

## 2012-06-12 NOTE — Assessment & Plan Note (Addendum)
Reviewed age and gender appropriate health maintenance issues (prudent diet, regular exercise, health risks of tobacco and excessive alcohol, use of seatbelts, fire alarms in home, use of sunscreen).  Also reviewed age and gender appropriate health screening as well as vaccine recommendations. Pt reports Tdap UTD--will get old records to confirm/document. She'll return at her earliest convenience for fasting CBC, CMET, TSH, and lipid panel.

## 2012-06-12 NOTE — Assessment & Plan Note (Signed)
Hx of SVT. Has f/u arranged with Dr. Antoine Poche next month. Continue Toprol XL 50mg  qhs.

## 2012-06-12 NOTE — Assessment & Plan Note (Signed)
Ketoconazole 2% shampoo nightly until much improved, then use qod and then prn after that.

## 2012-06-12 NOTE — Patient Instructions (Addendum)

## 2012-06-13 ENCOUNTER — Other Ambulatory Visit (INDEPENDENT_AMBULATORY_CARE_PROVIDER_SITE_OTHER): Payer: BC Managed Care – PPO

## 2012-06-13 DIAGNOSIS — Z0000 Encounter for general adult medical examination without abnormal findings: Secondary | ICD-10-CM

## 2012-06-13 LAB — COMPREHENSIVE METABOLIC PANEL
ALT: 13 U/L (ref 0–35)
AST: 20 U/L (ref 0–37)
Alkaline Phosphatase: 55 U/L (ref 39–117)
BUN: 18 mg/dL (ref 6–23)
CO2: 22 mEq/L (ref 19–32)
Chloride: 103 mEq/L (ref 96–112)
Creatinine, Ser: 0.7 mg/dL (ref 0.4–1.2)
Glucose, Bld: 74 mg/dL (ref 70–99)
Potassium: 4.2 mEq/L (ref 3.5–5.1)
Total Bilirubin: 0.6 mg/dL (ref 0.3–1.2)
Total Protein: 7.1 g/dL (ref 6.0–8.3)

## 2012-06-13 LAB — CBC WITH DIFFERENTIAL/PLATELET
Basophils Absolute: 0 10*3/uL (ref 0.0–0.1)
Basophils Relative: 0.5 % (ref 0.0–3.0)
Eosinophils Absolute: 0.1 10*3/uL (ref 0.0–0.7)
Lymphocytes Relative: 30.5 % (ref 12.0–46.0)
MCHC: 33.7 g/dL (ref 30.0–36.0)
Neutrophils Relative %: 58.8 % (ref 43.0–77.0)
Platelets: 203 10*3/uL (ref 150.0–400.0)
RBC: 3.89 Mil/uL (ref 3.87–5.11)
RDW: 12.6 % (ref 11.5–14.6)

## 2012-06-13 LAB — LIPID PANEL
Cholesterol: 158 mg/dL (ref 0–200)
Triglycerides: 76 mg/dL (ref 0.0–149.0)

## 2012-06-13 LAB — TSH: TSH: 0.35 u[IU]/mL (ref 0.35–5.50)

## 2012-06-13 NOTE — Progress Notes (Signed)
Labs only

## 2012-06-19 ENCOUNTER — Telehealth: Payer: Self-pay | Admitting: *Deleted

## 2012-06-19 NOTE — Progress Notes (Signed)
Quick Note:  Patient given lab results and follow up lab appt scheduled. 07-14-12 at 8:30 am. ______

## 2012-06-19 NOTE — Telephone Encounter (Signed)
Patient would like a more detailed explanation concerning her recent lab results. Please advise?

## 2012-06-23 ENCOUNTER — Encounter: Payer: Self-pay | Admitting: Family Medicine

## 2012-06-23 NOTE — Telephone Encounter (Signed)
I called pt and explained labs.   I reiterated the need for daily MVI with iron and need for repeat of these labs in about a month. Pt expressed understanding.

## 2012-07-14 ENCOUNTER — Other Ambulatory Visit (INDEPENDENT_AMBULATORY_CARE_PROVIDER_SITE_OTHER): Payer: BC Managed Care – PPO

## 2012-07-14 DIAGNOSIS — D729 Disorder of white blood cells, unspecified: Secondary | ICD-10-CM

## 2012-07-14 DIAGNOSIS — R718 Other abnormality of red blood cells: Secondary | ICD-10-CM

## 2012-07-14 LAB — CBC WITH DIFFERENTIAL/PLATELET
Eosinophils Relative: 3.8 % (ref 0.0–5.0)
HCT: 36.1 % (ref 36.0–46.0)
Lymphs Abs: 0.8 10*3/uL (ref 0.7–4.0)
MCHC: 34.3 g/dL (ref 30.0–36.0)
MCV: 89.9 fl (ref 78.0–100.0)
Monocytes Absolute: 0.4 10*3/uL (ref 0.1–1.0)
Platelets: 154 10*3/uL (ref 150.0–400.0)
RDW: 12.5 % (ref 11.5–14.6)
WBC: 4.7 10*3/uL (ref 4.5–10.5)

## 2012-07-14 NOTE — Progress Notes (Signed)
Labs only

## 2012-09-16 ENCOUNTER — Other Ambulatory Visit: Payer: Self-pay | Admitting: Family Medicine

## 2012-09-16 ENCOUNTER — Other Ambulatory Visit: Payer: Self-pay | Admitting: *Deleted

## 2012-09-16 MED ORDER — METOPROLOL SUCCINATE ER 25 MG PO TB24: ORAL_TABLET | ORAL | Status: DC

## 2012-09-16 NOTE — Telephone Encounter (Signed)
Pt requesting metoprolol refill.  Sent to pharmacy 90 day supply with one refill per protocol.

## 2012-09-17 ENCOUNTER — Other Ambulatory Visit: Payer: Self-pay | Admitting: Family Medicine

## 2012-09-17 MED ORDER — METOPROLOL SUCCINATE ER 25 MG PO TB24: ORAL_TABLET | ORAL | Status: DC

## 2013-01-20 ENCOUNTER — Ambulatory Visit (INDEPENDENT_AMBULATORY_CARE_PROVIDER_SITE_OTHER): Payer: BC Managed Care – PPO

## 2013-01-20 DIAGNOSIS — Z23 Encounter for immunization: Secondary | ICD-10-CM

## 2013-02-03 ENCOUNTER — Ambulatory Visit (INDEPENDENT_AMBULATORY_CARE_PROVIDER_SITE_OTHER): Payer: BC Managed Care – PPO | Admitting: Family Medicine

## 2013-02-03 ENCOUNTER — Encounter: Payer: Self-pay | Admitting: Family Medicine

## 2013-02-03 VITALS — BP 121/77 | HR 82 | Temp 98.8°F | Resp 18 | Ht 60.75 in | Wt 108.0 lb

## 2013-02-03 DIAGNOSIS — M7551 Bursitis of right shoulder: Secondary | ICD-10-CM

## 2013-02-03 DIAGNOSIS — M75100 Unspecified rotator cuff tear or rupture of unspecified shoulder, not specified as traumatic: Secondary | ICD-10-CM

## 2013-02-03 DIAGNOSIS — M7550 Bursitis of unspecified shoulder: Secondary | ICD-10-CM | POA: Insufficient documentation

## 2013-02-03 DIAGNOSIS — I471 Supraventricular tachycardia: Secondary | ICD-10-CM | POA: Insufficient documentation

## 2013-02-03 DIAGNOSIS — I498 Other specified cardiac arrhythmias: Secondary | ICD-10-CM

## 2013-02-03 MED ORDER — METOPROLOL SUCCINATE ER 25 MG PO TB24: ORAL_TABLET | ORAL | Status: DC

## 2013-02-03 MED ORDER — OXYCODONE-ACETAMINOPHEN 5-325 MG PO TABS: ORAL_TABLET | ORAL | Status: DC
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
## 2013-02-03 NOTE — Addendum Note (Signed)
Addended by: Jeoffrey Massed on: 02/03/2013 10:23 AM   Modules accepted: Level of Service

## 2013-02-03 NOTE — Assessment & Plan Note (Signed)
Asymptomatic. Normal exam today. Will RF med x 1 yr, pt to call or return if any problems in the meantime. She already avoids decongestants and minimizes caffeine.

## 2013-02-03 NOTE — Assessment & Plan Note (Signed)
Pt is very hesitant to get steroid injection into bursa. Decided to do naproxen 500 mg bid x 10d.   Percocet 5/325, 1-2 tabs po q6h prn, #30, no RF.  Therapeutic expectations and side effect profile of medication discussed today.  Patient's questions answered. Discussed a few home exercises she can do for her shoulder today.

## 2013-05-18 ENCOUNTER — Encounter: Payer: Self-pay | Admitting: Family Medicine

## 2013-05-18 ENCOUNTER — Ambulatory Visit (INDEPENDENT_AMBULATORY_CARE_PROVIDER_SITE_OTHER): Payer: BC Managed Care – PPO | Admitting: Family Medicine

## 2013-05-18 VITALS — BP 109/71 | HR 73 | Temp 99.0°F | Resp 16 | Ht 60.75 in | Wt 104.0 lb

## 2013-05-18 DIAGNOSIS — IMO0002 Reserved for concepts with insufficient information to code with codable children: Secondary | ICD-10-CM

## 2013-05-18 DIAGNOSIS — M7550 Bursitis of unspecified shoulder: Secondary | ICD-10-CM

## 2013-05-18 DIAGNOSIS — B309 Viral conjunctivitis, unspecified: Secondary | ICD-10-CM

## 2013-05-18 DIAGNOSIS — M75100 Unspecified rotator cuff tear or rupture of unspecified shoulder, not specified as traumatic: Secondary | ICD-10-CM

## 2013-05-18 MED ORDER — METHYLPREDNISOLONE ACETATE 40 MG/ML IJ SUSP
40.0000 mg | Freq: Once | INTRAMUSCULAR | Status: AC
  Administered 2013-05-18: 40 mg via INTRA_ARTICULAR

## 2013-05-18 MED ORDER — HYDROCODONE-ACETAMINOPHEN 5-325 MG PO TABS: ORAL_TABLET | ORAL | Status: DC

## 2013-05-18 NOTE — Progress Notes (Signed)
OFFICE NOTE  05/18/2013  CC:  Chief Complaint  Patient presents with  . Conjunctivitis    x Sunday     HPI: Patient is a 38 y.o. Caucasian female who is here for 24-36h left eye irritation, redness, drainage. Slight sniffles 1-2 wks ago, otherwise well.  No known sick contacts with similar sx's. Feels like a film over eye.  No double vision or FB sensation.  Also, right shoulder still bothering her, again now for over a month, like a constant nagging aching pain that is worse with reaching out and up, also while lying in bed at night trying to sleep. No injury prior.  Light wt. Home exercises for RC have helped her daytime sx's some. No response to NSAIDs.  She is asking about steroid injection today.  Pertinent PMH:  Past medical, surgical, social, and family history reviewed and no changes are noted since last office visit.  MEDS:  Outpatient Prescriptions Prior to Visit  Medication Sig Dispense Refill  . metoprolol succinate (TOPROL-XL) 25 MG 24 hr tablet Take two tablets daily  180 tablet  4  . naproxen (NAPROSYN) 500 MG tablet       . ketoconazole (NIZORAL) 2 % shampoo Apply topically 2 (two) times a week. Apply to affected area of scalp daily  120 mL  3  . oxyCODONE-acetaminophen (PERCOCET/ROXICET) 5-325 MG per tablet 1-2 tabs q6h as needed for pain  30 tablet  0   No facility-administered medications prior to visit.    PE: Blood pressure 109/71, pulse 73, temperature 99 F (37.2 C), temperature source Temporal, resp. rate 16, height 5' 0.75" (1.543 m), weight 104 lb (47.174 kg), last menstrual period 05/14/2013, SpO2 100.00%. Gen: Alert, well appearing.  Patient is oriented to person, place, time, and situation. UXL:KGMWENT:Eyes: right eye with no injection, icteris, swelling, or exudate.  Left eye with very mild diffuse injection of bulbar and palpebral conjunctiva.  No visible exudate.  EOMI, PERRLA. Mouth: lips without lesion/swelling.  Oral mucosa pink and moist. Oropharynx  without erythema, exudate, or swelling.  Right shoulder: No deformity. TTP around hook of acromion.  Mild TTP over upper trapezius soft tissues. No AC joint tenderness. Pain with abduction and she can get right arm to 120 deg, neg drop arm testing. Other RC testing neg.  O'brien's testing neg.  IMPRESSION AND PLAN:  Viral conjunctivitis Warm compresses. OTC zaditor eye drops prn. Expected course discussed. Signs/symptoms to call or return for were reviewed and pt expressed understanding.   Subacromial bursitis  Procedure: Therapeutic shoulder injection.  The patient's clinical condition is marked by substantial pain and/or significant functional disability.  Other conservative therapy has not provided relief, is contraindicated, or not appropriate.  There is a reasonable likelihood that injection will significantly improve the patient's pain and/or functional disability. Cleaned skin with alcohol swab, used posterolateral approach, Injected 1ml of 40mg /ml depo-medrol + 2  ml of 1% lidocaine without epi into subacromial space without resistance.  No immediate complications.  Patient tolerated procedure well.  Post-injection care discussed, including 20 min of icing 1-2 times in the next 4-8 hours, frequent non weight-bearing ROM exercises over the next few days, and general pain medication management. Vicodin 5/325, 1-2 q6h prn, #20,no RF.    An After Visit Summary was printed and given to the patient.  FOLLOW UP: prn

## 2013-05-18 NOTE — Assessment & Plan Note (Signed)
Warm compresses. OTC zaditor eye drops prn. Expected course discussed. Signs/symptoms to call or return for were reviewed and pt expressed understanding.

## 2013-05-18 NOTE — Patient Instructions (Signed)
Buy generic OTC for Zaditor (eye drops) and use as instructed on packaging.

## 2013-05-18 NOTE — Addendum Note (Signed)
Addended by: Eulah PontALBRIGHT, LISA M on: 05/18/2013 01:14 PM   Modules accepted: Orders

## 2013-05-18 NOTE — Assessment & Plan Note (Signed)
  Procedure: Therapeutic shoulder injection.  The patient's clinical condition is marked by substantial pain and/or significant functional disability.  Other conservative therapy has not provided relief, is contraindicated, or not appropriate.  There is a reasonable likelihood that injection will significantly improve the patient's pain and/or functional disability. Cleaned skin with alcohol swab, used posterolateral approach, Injected 1ml of 40mg /ml depo-medrol + 2  ml of 1% lidocaine without epi into subacromial space without resistance.  No immediate complications.  Patient tolerated procedure well.  Post-injection care discussed, including 20 min of icing 1-2 times in the next 4-8 hours, frequent non weight-bearing ROM exercises over the next few days, and general pain medication management. Vicodin 5/325, 1-2 q6h prn, #20,no RF.

## 2013-05-18 NOTE — Progress Notes (Signed)
Pre visit review using our clinic review tool, if applicable. No additional management support is needed unless otherwise documented below in the visit note. 

## 2014-01-04 ENCOUNTER — Encounter: Payer: Self-pay | Admitting: Family Medicine

## 2014-01-19 ENCOUNTER — Ambulatory Visit: Payer: BC Managed Care – PPO

## 2014-01-19 ENCOUNTER — Ambulatory Visit (INDEPENDENT_AMBULATORY_CARE_PROVIDER_SITE_OTHER): Payer: BC Managed Care – PPO | Admitting: Family Medicine

## 2014-01-19 DIAGNOSIS — Z23 Encounter for immunization: Secondary | ICD-10-CM

## 2014-03-11 ENCOUNTER — Other Ambulatory Visit: Payer: Self-pay | Admitting: Family Medicine

## 2014-03-11 MED ORDER — METOPROLOL SUCCINATE ER 25 MG PO TB24: ORAL_TABLET | ORAL | Status: DC

## 2014-03-11 NOTE — Telephone Encounter (Signed)
Pt requesting refill of metoprolol.  Pt is due for CPE, but I sent in 90 day supply and patient will return before next RF for CPE.

## 2014-03-17 ENCOUNTER — Other Ambulatory Visit: Payer: Self-pay | Admitting: Family Medicine

## 2014-03-17 MED ORDER — METOPROLOL SUCCINATE ER 25 MG PO TB24: ORAL_TABLET | ORAL | Status: DC

## 2014-04-07 ENCOUNTER — Encounter: Payer: Self-pay | Admitting: Family Medicine

## 2014-04-07 ENCOUNTER — Ambulatory Visit (INDEPENDENT_AMBULATORY_CARE_PROVIDER_SITE_OTHER): Payer: 59 | Admitting: Family Medicine

## 2014-04-07 VITALS — BP 109/74 | HR 70 | Temp 98.7°F | Ht 60.75 in | Wt 111.0 lb

## 2014-04-07 DIAGNOSIS — Z0000 Encounter for general adult medical examination without abnormal findings: Secondary | ICD-10-CM

## 2014-04-07 DIAGNOSIS — F418 Other specified anxiety disorders: Secondary | ICD-10-CM

## 2014-04-07 LAB — LIPID PANEL
CHOL/HDL RATIO: 3
Cholesterol: 169 mg/dL (ref 0–200)
HDL: 56.3 mg/dL (ref >39.00)
LDL CALC: 100 mg/dL — AB (ref 0–99)
NONHDL: 112.7
Triglycerides: 63 mg/dL (ref 0.0–149.0)
VLDL: 12.6 mg/dL (ref 0.0–40.0)

## 2014-04-07 LAB — CBC WITH DIFFERENTIAL/PLATELET
BASOS ABS: 0 10*3/uL (ref 0.0–0.1)
Basophils Relative: 0.2 % (ref 0.0–3.0)
EOS ABS: 0.1 10*3/uL (ref 0.0–0.7)
Eosinophils Relative: 1.5 % (ref 0.0–5.0)
HCT: 36.6 % (ref 36.0–46.0)
Hemoglobin: 12.5 g/dL (ref 12.0–15.0)
LYMPHS ABS: 1.6 10*3/uL (ref 0.7–4.0)
LYMPHS PCT: 27.3 % (ref 12.0–46.0)
MCHC: 34.2 g/dL (ref 30.0–36.0)
MCV: 90 fl (ref 78.0–100.0)
MONO ABS: 0.4 10*3/uL (ref 0.1–1.0)
MONOS PCT: 6.6 % (ref 3.0–12.0)
NEUTROS ABS: 3.8 10*3/uL (ref 1.4–7.7)
NEUTROS PCT: 64.4 % (ref 43.0–77.0)
PLATELETS: 217 10*3/uL (ref 150.0–400.0)
RBC: 4.06 Mil/uL (ref 3.87–5.11)
RDW: 12.6 % (ref 11.5–15.5)
WBC: 5.9 10*3/uL (ref 4.0–10.5)

## 2014-04-07 LAB — COMPREHENSIVE METABOLIC PANEL
ALBUMIN: 4.7 g/dL (ref 3.5–5.2)
ALT: 11 U/L (ref 0–35)
AST: 19 U/L (ref 0–37)
Alkaline Phosphatase: 70 U/L (ref 39–117)
BUN: 14 mg/dL (ref 6–23)
CALCIUM: 9.2 mg/dL (ref 8.4–10.5)
CO2: 26 mEq/L (ref 19–32)
CREATININE: 0.64 mg/dL (ref 0.40–1.20)
Chloride: 105 mEq/L (ref 96–112)
GFR: 109.67 mL/min (ref >60.00)
Glucose, Bld: 69 mg/dL — ABNORMAL LOW (ref 70–99)
Potassium: 4.9 mEq/L (ref 3.5–5.1)
SODIUM: 138 meq/L (ref 135–145)
TOTAL PROTEIN: 6.9 g/dL (ref 6.0–8.3)
Total Bilirubin: 0.8 mg/dL (ref 0.2–1.2)

## 2014-04-07 LAB — TSH: TSH: 1.39 u[IU]/mL (ref 0.35–4.50)

## 2014-04-07 MED ORDER — METOPROLOL SUCCINATE ER 25 MG PO TB24: ORAL_TABLET | ORAL | Status: DC

## 2014-04-07 MED ORDER — LORAZEPAM 0.5 MG PO TABS: ORAL_TABLET | ORAL | Status: DC

## 2014-04-07 NOTE — Progress Notes (Signed)
Office Note 04/07/2014  CC:  Chief Complaint  Patient presents with  . Annual Exam   HPI:  Crystal Friedman is a 39 y.o. White female who is here for annual CPE. GYN MD does her cerv ca screening. Shoulder is doing great since I did steroid injection in it about a year ago.  She needs RF of Toprol XL 25mg , takes 2 tabs per day for her SVT.  No signif palpitations/rapid heart rate.  She asks for breast exam today b/c of feeling intermittent small bump in left breast area where breast meets her arm pit. No nipple d/c, no persistent mass has been palpable.  Says she feels anxiety when her husband is traveling.  He is gone for 2 week periods for overseas trips. She is looking for taking a med on an intermittent basis when her anxiety level/worry gets high. No other situations seem to make her anxious.  No panic attacks.  She does not describe herself as a chronic worrier who suffers from chronic anxiety.  She has never taken an anxiety med or antidepressant. Says mood is fine.   Past Medical History  Diagnosis Date  . Arrhythmia     tachypalpiations  . Asthma   . SVT (supraventricular tachycardia)   . Hay fever     Past Surgical History  Procedure Laterality Date  . Dilation and curettage of uterus    . Tonsillectomy      Family History  Problem Relation Age of Onset  . Diabetes Father   . Hypertension Maternal Grandmother   . Hypertension Maternal Grandfather     History   Social History  . Marital Status: Married    Spouse Name: N/A    Number of Children: N/A  . Years of Education: N/A   Occupational History  . Not on file.   Social History Main Topics  . Smoking status: Never Smoker   . Smokeless tobacco: Never Used  . Alcohol Use: Yes  . Drug Use: No  . Sexual Activity: Yes    Birth Control/ Protection: None   Other Topics Concern  . 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   Has lived in [place] since 2004.   No T/A/Ds.   Exercise: walks 2-3 miles per day, runs some, P90 X.   Regular american diet.   MEDS: Toprol XL 25mg , 2 tabs po qd  No Known Allergies  ROS Review of Systems  Constitutional: Negative for fever, chills, appetite change and fatigue.  HENT: Negative for congestion, dental problem, ear pain and sore throat.   Eyes: Negative for discharge, redness and visual disturbance.  Respiratory: Negative for cough, chest tightness, shortness of breath and wheezing.   Cardiovascular: Negative for chest pain, palpitations and leg swelling.  Gastrointestinal: Negative for nausea, vomiting, abdominal pain, diarrhea and blood in stool.  Genitourinary: Negative for dysuria, urgency, frequency, hematuria, flank pain and difficulty urinating.  Musculoskeletal: Negative for myalgias, back pain, joint swelling, arthralgias and neck stiffness.  Skin: Negative for pallor and rash.  Neurological: Negative for dizziness, speech difficulty, weakness and headaches.  Hematological: Negative for adenopathy. Does not bruise/bleed easily.  Psychiatric/Behavioral: Negative for confusion and sleep disturbance. The patient is not nervous/anxious.     PE; Blood pressure 109/74, pulse 70, temperature 98.7 F (37.1 C), temperature source Temporal, height 5' 0.75" (1.543 m), weight 111  lb (50.349 kg), last menstrual period 03/25/2014, SpO2 99 %. Gen: Alert, well appearing.  Patient is oriented to person, place, time, and situation. AFFECT: pleasant, lucid thought and speech. ENT: Ears: EACs clear, normal epithelium.  TMs with good light reflex and landmarks bilaterally.  Eyes: no injection, icteris, swelling, or exudate.  EOMI, PERRLA. Nose: no drainage or turbinate edema/swelling.  No injection or focal lesion.  Mouth: lips without lesion/swelling.  Oral mucosa pink and moist.  Dentition intact and without obvious caries or gingival swelling.  Oropharynx without  erythema, exudate, or swelling.  Neck: supple/nontender.  No LAD, mass, or TM.  Carotid pulses 2+ bilaterally, without bruits. CV: RRR, no m/r/g.   LUNGS: CTA bilat, nonlabored resps, good aeration in all lung fields. ABD: soft, NT, ND, BS normal.  No hepatospenomegaly or mass.  No bruits. EXT: no clubbing, cyanosis, or edema.  Musculoskeletal: no joint swelling, erythema, warmth, or tenderness.  ROM of all joints intact. Skin - no sores or suspicious lesions or rashes or color changes Breasts are symmetric.  No dominant, discrete, fixed  or suspicious masses are noted.  No skin or nipple changes or axillary nodes.  Pertinent labs:  None today (HP labs drawn today)  ASSESSMENT AND PLAN:   1) Situational anxiety: ativan 0.5mg , 1-2 tid prn (#30, RF x 1) to use when she gets high anxiety with husband out of town. Therapeutic expectations and side effect profile of medication discussed today.  Patient's questions answered.  2) Health maintenance exam: Reviewed age and gender appropriate health maintenance issues (prudent diet, regular exercise, health risks of tobacco and excessive alcohol, use of seatbelts, fire alarms in home, use of sunscreen).  Also reviewed age and gender appropriate health screening as well as vaccine recommendations. Breast exam normal today; reassured pt that she may have been feeling a bit of fibrocystic changes at the end of her cycle. Signs/symptoms to call or return for were reviewed and pt expressed understanding. HP labs drawn today. She'll continue to get appropriate cerv ca screening through her GYN MD.  3) SVT: she is doing well on  qd Toprol XL.  RF's for 1 yr given today.  An After Visit Summary was printed and given to the patient.  FOLLOW UP:  Return in about 1 year (around 04/08/2015) for annual CPE (fasting).

## 2014-04-07 NOTE — Progress Notes (Signed)
Pre visit review using our clinic review tool, if applicable. No additional management support is needed unless otherwise documented below in the visit note. 

## 2014-04-08 MED ORDER — METOPROLOL SUCCINATE ER 25 MG PO TB24: ORAL_TABLET | ORAL | Status: DC

## 2014-04-08 NOTE — Addendum Note (Signed)
Addended by: Eulah PontALBRIGHT, Jenah Vanasten M on: 04/08/2014 03:15 PM   Modules accepted: Orders

## 2014-12-02 ENCOUNTER — Other Ambulatory Visit: Payer: Self-pay | Admitting: *Deleted

## 2014-12-02 MED ORDER — METOPROLOL SUCCINATE ER 25 MG PO TB24: ORAL_TABLET | ORAL | Status: DC

## 2014-12-02 NOTE — Telephone Encounter (Signed)
RF request for Metoprolol LOV: 04/07/14 Next ov: None Last written: 04/08/14 #180 w/ 3RF

## 2014-12-02 NOTE — Telephone Encounter (Signed)
12/02/14 at 11:17am Left message for pt to call back. Need to know if pt is waiting for mail order to come in or do we need to send new Rx to mail order as well. It looks like she should still have refills. Will go ahead and send #[facility] to [facility] as pt requested.   Pt LMOM on 12/02/14 at 8:33am requesting refill for Metoprolol. She stated that she normally gets this through her mail order but is out and needs refill sent to [facility].

## 2014-12-02 NOTE — Telephone Encounter (Signed)
Pt called back and LMOM at [employer] [employer]. I returned pts call NA, left detailed message on pts cell vm, okay per DPR.

## 2015-02-04 ENCOUNTER — Ambulatory Visit (INDEPENDENT_AMBULATORY_CARE_PROVIDER_SITE_OTHER): Payer: 59

## 2015-02-04 DIAGNOSIS — Z23 Encounter for immunization: Secondary | ICD-10-CM

## 2015-03-11 ENCOUNTER — Other Ambulatory Visit: Payer: Self-pay | Admitting: Family Medicine

## 2015-03-11 NOTE — Telephone Encounter (Signed)
RF request for metoprolol LOV: 04/07/14 Next ov: None Last written: 12/02/14 #30 w/ 0RF  Rx sent for #180 w/ 0RF. Pt needs ov for more refills.

## 2015-03-15 NOTE — Telephone Encounter (Signed)
Pt advised and voiced understanding.  Apt made for 04/12/15 at 10:00am.

## 2015-04-12 ENCOUNTER — Ambulatory Visit (INDEPENDENT_AMBULATORY_CARE_PROVIDER_SITE_OTHER): Payer: 59 | Admitting: Family Medicine

## 2015-04-12 ENCOUNTER — Other Ambulatory Visit: Payer: Self-pay | Admitting: *Deleted

## 2015-04-12 ENCOUNTER — Encounter: Payer: Self-pay | Admitting: Family Medicine

## 2015-04-12 VITALS — BP 110/78 | HR 67 | Temp 97.4°F | Resp 16 | Ht 61.75 in | Wt 113.2 lb

## 2015-04-12 DIAGNOSIS — Z23 Encounter for immunization: Secondary | ICD-10-CM

## 2015-04-12 DIAGNOSIS — Z1231 Encounter for screening mammogram for malignant neoplasm of breast: Secondary | ICD-10-CM | POA: Diagnosis not present

## 2015-04-12 DIAGNOSIS — Z0000 Encounter for general adult medical examination without abnormal findings: Secondary | ICD-10-CM | POA: Diagnosis not present

## 2015-04-12 LAB — CBC WITH DIFFERENTIAL/PLATELET
Basophils Absolute: 0 10*3/uL (ref 0.0–0.1)
Basophils Relative: 0.4 % (ref 0.0–3.0)
Eosinophils Absolute: 0.1 10*3/uL (ref 0.0–0.7)
Eosinophils Relative: 2 % (ref 0.0–5.0)
HCT: 39.6 % (ref 36.0–46.0)
Hemoglobin: 12.9 g/dL (ref 12.0–15.0)
LYMPHS PCT: 33.7 % (ref 12.0–46.0)
Lymphs Abs: 1.6 10*3/uL (ref 0.7–4.0)
MCHC: 32.5 g/dL (ref 30.0–36.0)
MCV: 92.6 fl (ref 78.0–100.0)
MONO ABS: 0.4 10*3/uL (ref 0.1–1.0)
Monocytes Relative: 7.3 % (ref 3.0–12.0)
NEUTROS ABS: 2.7 10*3/uL (ref 1.4–7.7)
NEUTROS PCT: 56.6 % (ref 43.0–77.0)
Platelets: 210 10*3/uL (ref 150.0–400.0)
RBC: 4.27 Mil/uL (ref 3.87–5.11)
RDW: 13.2 % (ref 11.5–15.5)
WBC: 4.8 10*3/uL (ref 4.0–10.5)

## 2015-04-12 LAB — COMPREHENSIVE METABOLIC PANEL
ALK PHOS: 67 U/L (ref 39–117)
ALT: 12 U/L (ref 0–35)
AST: 18 U/L (ref 0–37)
Albumin: 4.7 g/dL (ref 3.5–5.2)
BILIRUBIN TOTAL: 0.8 mg/dL (ref 0.2–1.2)
BUN: 12 mg/dL (ref 6–23)
CO2: 31 meq/L (ref 19–32)
Calcium: 9.3 mg/dL (ref 8.4–10.5)
Chloride: 103 mEq/L (ref 96–112)
Creatinine, Ser: 0.65 mg/dL (ref 0.40–1.20)
GFR: 107.17 mL/min (ref >60.00)
GLUCOSE: 79 mg/dL (ref 70–99)
Potassium: 4.6 mEq/L (ref 3.5–5.1)
Sodium: 139 mEq/L (ref 135–145)
TOTAL PROTEIN: 7.1 g/dL (ref 6.0–8.3)

## 2015-04-12 LAB — LIPID PANEL
CHOL/HDL RATIO: 3
Cholesterol: 174 mg/dL (ref 0–200)
HDL: 57.6 mg/dL (ref >39.00)
LDL Cholesterol: 104 mg/dL — ABNORMAL HIGH (ref 0–99)
NONHDL: 116.87
Triglycerides: 64 mg/dL (ref 0.0–149.0)
VLDL: 12.8 mg/dL (ref 0.0–40.0)

## 2015-04-12 LAB — TSH: TSH: 1.08 u[IU]/mL (ref 0.35–4.50)

## 2015-04-12 MED ORDER — METOPROLOL SUCCINATE ER 25 MG PO TB24: ORAL_TABLET | ORAL | Status: DC

## 2015-04-12 MED ORDER — LORAZEPAM 0.5 MG PO TABS: ORAL_TABLET | ORAL | Status: DC

## 2015-04-12 NOTE — Progress Notes (Signed)
Office Note 04/12/2015  CC:  Chief Complaint  Patient presents with  . Annual Exam    Pt is fasting.    HPI:  Crystal Friedman is a 40 y.o. White female who is here for annual health maintenance exam. Doing well and without complaint. Ativan prn helpful for the rare times she has anxiety such as when she is left home alone with the kids for 1-2 wks when husband traveling.      Past Medical History  Diagnosis Date  . Arrhythmia     tachypalpiations  . Asthma   . SVT (supraventricular tachycardia) (HCC)   . Hay fever     Past Surgical History  Procedure Laterality Date  . Dilation and curettage of uterus    . Tonsillectomy      Family History  Problem Relation Age of Onset  . Diabetes Father   . Hypertension Maternal Grandmother   . Hypertension Maternal Grandfather     Social History   Social History  . Marital Status: Married    Spouse Name: N/A  . Number of Children: N/A  . Years of Education: N/A   Occupational History  . Not on file.   Social History Main Topics  . Smoking status: Never Smoker   . Smokeless tobacco: Never Used  . Alcohol Use: Yes  . Drug Use: No  . Sexual Activity: Yes    Birth Control/ Protection: None   Other Topics Concern  . 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   Has lived in [place] since 2004.   No T/A/Ds.   Exercise: walks 2-3 miles per day, runs some, P90 X.   Regular american diet.    Outpatient Prescriptions Prior to Visit  Medication Sig Dispense Refill  . naproxen (NAPROSYN) 500 MG tablet     . LORazepam (ATIVAN) 0.5 MG tablet 1-2 tabs po tid prn anxiety 30 tablet 1  . metoprolol succinate (TOPROL-XL) 25 MG 24 hr tablet Take two tablets daily 30 tablet 0  . ketoconazole (NIZORAL) 2 % shampoo Apply topically 2 (two) times a week. Apply to affected area of scalp daily (Patient not taking: Reported on 04/07/2014) 120 mL 3  .  metoprolol succinate (TOPROL-XL) 25 MG 24 hr tablet Take 2 tablets by mouth  daily (Patient not taking: Reported on 04/12/2015) 180 tablet 0   No facility-administered medications prior to visit.    No Known Allergies  ROS Review of Systems  Constitutional: Negative for fever, chills, appetite change and fatigue.  HENT: Negative for congestion, dental problem, ear pain and sore throat.   Eyes: Negative for discharge, redness and visual disturbance.  Respiratory: Negative for cough, chest tightness, shortness of breath and wheezing.   Cardiovascular: Negative for chest pain, palpitations and leg swelling.  Gastrointestinal: Negative for nausea, vomiting, abdominal pain, diarrhea and blood in stool.  Genitourinary: Negative for dysuria, urgency, frequency, hematuria, flank pain and difficulty urinating.  Musculoskeletal: Negative for myalgias, back pain, joint swelling, arthralgias and neck stiffness.  Skin: Negative for pallor and rash.  Neurological: Negative for dizziness, speech difficulty, weakness and headaches.  Hematological: Negative for adenopathy. Does not bruise/bleed easily.  Psychiatric/Behavioral: Negative for confusion and sleep disturbance. The patient is not nervous/anxious.     PE; Blood pressure 110/78, pulse 67, temperature 97.4 F (36.3 C), temperature source Oral, resp. rate 16, height 5'  1.75" (1.568 m), weight 113 lb 4 oz (51.37 kg), last menstrual period 04/10/2015, SpO2 100 %.  Pt examined with Wallace Keller, CMA, as chaperone.  Gen: Alert, well appearing.  Patient is oriented to person, place, time, and situation. AFFECT: pleasant, lucid thought and speech. ENT: Ears: EACs clear, normal epithelium.  TMs with good light reflex and landmarks bilaterally.  Eyes: no injection, icteris, swelling, or exudate.  EOMI, PERRLA. Nose: no drainage or turbinate edema/swelling.  No injection or focal lesion.  Mouth: lips without lesion/swelling.  Oral mucosa pink and moist.   Dentition intact and without obvious caries or gingival swelling.  Oropharynx without erythema, exudate, or swelling.  Neck: supple/nontender.  No LAD, mass, or TM.  Carotid pulses 2+ bilaterally, without bruits. CV: RRR, no m/r/g.   LUNGS: CTA bilat, nonlabored resps, good aeration in all lung fields. ABD: soft, NT, ND, BS normal.  No hepatospenomegaly or mass.  No bruits. EXT: no clubbing, cyanosis, or edema.  Musculoskeletal: no joint swelling, erythema, warmth, or tenderness.  ROM of all joints intact. Skin - no sores or suspicious lesions or rashes or color changes  Pertinent labs:  None today  ASSESSMENT AND PLAN:   Health maintenance exam: Reviewed age and gender appropriate health maintenance issues (prudent diet, regular exercise, health risks of tobacco and excessive alcohol, use of seatbelts, fire alarms in home, use of sunscreen).  Also reviewed age and gender appropriate health screening as well as vaccine recommendations. Tdap given today. Her pap is UTD via Dr. Estanislado Pandy, GYN.   She has not had her first screening mammogram yet so I'll order this. Fasting HP labs drawn today. RF'd ativan 0.5mg  tabs, #30, RF x 1.  An After Visit Summary was printed and given to the patient.   FOLLOW UP:  Return in about 1 year (around 04/11/2016) for annual CPE (fasting).

## 2015-04-12 NOTE — Progress Notes (Signed)
Pre visit review using our clinic review tool, if applicable. No additional management support is needed unless otherwise documented below in the visit note. 

## 2015-04-12 NOTE — Addendum Note (Signed)
Addended by: Smitty Knudsen on: 04/12/2015 10:47 AM   Modules accepted: Orders

## 2015-04-12 NOTE — Telephone Encounter (Signed)
Pt called requesting refill. Please advise. Thanks. 

## 2015-04-13 MED ORDER — NAPROXEN 500 MG PO TABS: ORAL_TABLET | ORAL | Status: DC

## 2015-09-30 ENCOUNTER — Other Ambulatory Visit: Payer: Self-pay | Admitting: Family Medicine

## 2015-09-30 NOTE — Telephone Encounter (Signed)
RF request for metoprolol LOV: 04/12/15 Next ov: None Last written: 04/12/15 #90 w/ 3RF

## 2016-01-18 ENCOUNTER — Ambulatory Visit (INDEPENDENT_AMBULATORY_CARE_PROVIDER_SITE_OTHER): Payer: 59

## 2016-01-18 DIAGNOSIS — Z23 Encounter for immunization: Secondary | ICD-10-CM | POA: Diagnosis not present

## 2016-07-19 ENCOUNTER — Telehealth: Payer: Self-pay | Admitting: Family Medicine

## 2016-07-19 MED ORDER — LORAZEPAM 0.5 MG PO TABS: ORAL_TABLET | ORAL | 0 refills | Status: DC

## 2016-07-19 NOTE — Telephone Encounter (Signed)
Pt would a refill on her [facility]azepam [facility] [place] [place] [place] please.

## 2016-07-19 NOTE — Telephone Encounter (Signed)
I will rx #30 lorazepam, but pt needs to make appt for CPE or for anxiety follow up sometime in the next 2 months.-thx

## 2016-07-19 NOTE — Telephone Encounter (Signed)
RF request for lorazepam LOV: 04/12/15 Next ov:  None Last written: 04/12/15 #30 w/ 1RF  Please advise. Thanks.

## 2016-07-19 NOTE — Telephone Encounter (Signed)
Rx faxed.   Pt advised and voiced understanding.   

## 2016-12-20 ENCOUNTER — Other Ambulatory Visit: Payer: Self-pay | Admitting: Family Medicine

## 2016-12-20 DIAGNOSIS — Z1231 Encounter for screening mammogram for malignant neoplasm of breast: Secondary | ICD-10-CM

## 2017-01-02 ENCOUNTER — Ambulatory Visit (INDEPENDENT_AMBULATORY_CARE_PROVIDER_SITE_OTHER): Payer: 59

## 2017-01-02 DIAGNOSIS — Z1231 Encounter for screening mammogram for malignant neoplasm of breast: Secondary | ICD-10-CM

## 2017-01-02 DIAGNOSIS — R928 Other abnormal and inconclusive findings on diagnostic imaging of breast: Secondary | ICD-10-CM | POA: Diagnosis not present

## 2017-01-03 DIAGNOSIS — R928 Other abnormal and inconclusive findings on diagnostic imaging of breast: Secondary | ICD-10-CM

## 2017-01-03 HISTORY — DX: Other abnormal and inconclusive findings on diagnostic imaging of breast: R92.8

## 2017-01-04 ENCOUNTER — Other Ambulatory Visit: Payer: Self-pay | Admitting: Family Medicine

## 2017-01-04 DIAGNOSIS — R928 Other abnormal and inconclusive findings on diagnostic imaging of breast: Secondary | ICD-10-CM

## 2017-01-07 ENCOUNTER — Ambulatory Visit (INDEPENDENT_AMBULATORY_CARE_PROVIDER_SITE_OTHER): Payer: 59

## 2017-01-07 DIAGNOSIS — Z23 Encounter for immunization: Secondary | ICD-10-CM | POA: Diagnosis not present

## 2017-01-11 ENCOUNTER — Encounter: Payer: Self-pay | Admitting: Family Medicine

## 2017-01-11 ENCOUNTER — Other Ambulatory Visit: Payer: Self-pay | Admitting: Family Medicine

## 2017-01-11 ENCOUNTER — Ambulatory Visit
Admission: RE | Admit: 2017-01-11 | Discharge: 2017-01-11 | Disposition: A | Discharge status: Home / Self Care | Payer: 59 | Source: Ambulatory Visit | Attending: Family Medicine | Admitting: Family Medicine

## 2017-01-11 DIAGNOSIS — R928 Other abnormal and inconclusive findings on diagnostic imaging of breast: Secondary | ICD-10-CM

## 2017-01-11 DIAGNOSIS — N6489 Other specified disorders of breast: Secondary | ICD-10-CM | POA: Diagnosis not present

## 2017-01-11 DIAGNOSIS — N6310 Unspecified lump in the right breast, unspecified quadrant: Secondary | ICD-10-CM

## 2017-01-11 DIAGNOSIS — R922 Inconclusive mammogram: Secondary | ICD-10-CM | POA: Diagnosis not present

## 2017-06-14 ENCOUNTER — Encounter: Payer: Self-pay | Admitting: Family Medicine

## 2017-06-14 ENCOUNTER — Ambulatory Visit (INDEPENDENT_AMBULATORY_CARE_PROVIDER_SITE_OTHER): Payer: 59 | Admitting: Family Medicine

## 2017-06-14 VITALS — BP 118/84 | HR 94 | Temp 97.5°F | Ht 61.75 in | Wt 112.4 lb

## 2017-06-14 DIAGNOSIS — R51 Headache: Secondary | ICD-10-CM

## 2017-06-14 DIAGNOSIS — J3489 Other specified disorders of nose and nasal sinuses: Secondary | ICD-10-CM | POA: Diagnosis not present

## 2017-06-14 DIAGNOSIS — J340 Abscess, furuncle and carbuncle of nose: Secondary | ICD-10-CM

## 2017-06-14 DIAGNOSIS — R200 Anesthesia of skin: Secondary | ICD-10-CM

## 2017-06-14 DIAGNOSIS — R519 Headache, unspecified: Secondary | ICD-10-CM

## 2017-06-14 MED ORDER — LORAZEPAM 0.5 MG PO TABS: ORAL_TABLET | ORAL | 0 refills | Status: DC

## 2017-06-14 MED ORDER — AMOXICILLIN-POT CLAVULANATE 875-125 MG PO TABS: 1.0000 | ORAL_TABLET | Freq: Two times a day (BID) | ORAL | 0 refills | Status: DC

## 2017-06-14 MED ORDER — MUPIROCIN 2 % EX OINT: 1.0000 "application " | TOPICAL_OINTMENT | Freq: Three times a day (TID) | CUTANEOUS | 0 refills | Status: DC

## 2017-06-14 NOTE — Progress Notes (Addendum)
OFFICE VISIT  06/14/2017   CC:  Chief Complaint  Patient presents with  . Headache    Pt c/o of headache rated 6/10 plus facial pressure X 2 days.   HPI:    Patient is a 42 y.o. female who presents for "sinus" symptoms. Onset 2 d/a, constant,--> lots of pressure in face, nose bleed yesterday from L nostril.  No signif nasal congestion.  Has some allergies lately--with no signif change.  Right side of face/head hurting as well as feeling numb--constant ache (no sharp/shooting pains).  R eye tearing.  No eyelid drop.   Pain in nose on R side, right upper teeth hurting a lot.  No face swelling.   Taking mucinex and sudafed.  Has nasacort but not using lately--"allergies have been pretty good so far". No nausea, no light or noise sensitivity. No fever or cough or vision c/o.  No hx of migraines.    Past Medical History:  Diagnosis Date  . Abnormal mammogram of right breast 01/03/2017   diagnostic mammo and u/s showed 2 benign-appearing lesions.  F/u diagnostic mammo and u/s of R breast in 6 mo recommended.  . Arrhythmia    tachypalpiations  . Asthma   . Hay fever   . SVT (supraventricular tachycardia) (HCC)     Past Surgical History:  Procedure Laterality Date  . AUGMENTATION MAMMAPLASTY Bilateral    Saline, Retropec   . DILATION AND CURETTAGE OF UTERUS    . TONSILLECTOMY      Outpatient Medications Prior to Visit  Medication Sig Dispense Refill  . metoprolol succinate (TOPROL-XL) 25 MG 24 hr tablet Take 2 tablets by mouth  daily 180 tablet 3  . naproxen (NAPROSYN) 500 MG tablet 1 tab po bid prn with food 60 tablet 5  . LORazepam (ATIVAN) 0.5 MG tablet 1-2 tabs po tid prn anxiety 30 tablet 0   No facility-administered medications prior to visit.     No Known Allergies  ROS As per HPI  PE: Blood pressure 118/84, pulse 94, temperature (!) 97.5 F (36.4 C), temperature source Oral, height 5' 1.75" (1.568 m), weight 112 lb 6.4 oz (51 kg), last menstrual period  05/10/2017, SpO2 100 %. Gen: Alert, well appearing.  Patient is oriented to person, place, time, and situation. AFFECT: pleasant, lucid thought and speech. ENT: Ears: EACs clear, normal epithelium.  TMs with good light reflex and landmarks bilaterally.  Eyes: no injection, icteris, swelling, or exudate.  EOMI, PERRLA.  NO ptosis.  Nose: no drainag.  Significant tenderness in R nostril with insertion of nasal speculum, and a punctate ulceration on lateral aspect of nostril lining is noted, without surrounding swelling, no exudate from the ulceration.  No blood.  Left nostril with no blood or mucous.  Nasal mucosa normal appearing.  No injection or focal lesion.  Mouth: lips without lesion/swelling.  Oral mucosa pink and moist.  Dentition intact and without obvious caries or gingival swelling.  No tenderness of any where on face (except R nasal ala).  No erythema or nose swelling.  No teeth tenderness. Oropharynx without erythema, exudate, or swelling.  Neck - No masses or thyromegaly or limitation in range of motion   LABS:    Chemistry      Component Value Date/Time   NA 139 04/12/2015 1037   K 4.6 04/12/2015 1037   CL 103 04/12/2015 1037   CO2 31 04/12/2015 1037   BUN 12 04/12/2015 1037   CREATININE 0.65 04/12/2015 1037  Component Value Date/Time   CALCIUM 9.3 04/12/2015 1037   ALKPHOS 67 04/12/2015 1037   AST 18 04/12/2015 1037   ALT 12 04/12/2015 1037   BILITOT 0.8 04/12/2015 1037       IMPRESSION AND PLAN:  1) R sided facial pain and numbness, acute (2 days)--all sx's seem to radiate from R nostril as the focus.   Question atypical sinusitis, but with such pain and tenderness of R side of nose and punctate ulceration found in R nostril on exam this could be early nasal vestibulitis.   Doubt atypical migraine.  This would be atypical for trigeminal neuralgia. Will check sinus plain films, start augmentin 875mg  bid x 10d and bactroban ointment to R nostril tid. Use nasal  saline in L nostril --try to diminish chance of nose bleeds that may be secondary to nasal dryness. Avoid nasal steroid. No naproxen: use tylenol prn for pain. If worsens or if not improving any at f/u in 3-4 d then I will refer her to ENT.  Spent 25 min with pt today, with >50% of this time spent in counseling and care coordination regarding the above problems.  An After Visit Summary was printed and given to the patient.  FOLLOW UP: Return for 3-4 days f/u R nostril cellulitis.  Signed:  Santiago BumpersPhil Carmichael Burdette, MD           06/14/2017

## 2017-06-17 ENCOUNTER — Ambulatory Visit (HOSPITAL_BASED_OUTPATIENT_CLINIC_OR_DEPARTMENT_OTHER)
Admission: RE | Admit: 2017-06-17 | Discharge: 2017-06-17 | Disposition: A | Discharge status: Home / Self Care | Payer: 59 | Source: Ambulatory Visit | Attending: Family Medicine | Admitting: Family Medicine

## 2017-06-17 DIAGNOSIS — R51 Headache: Secondary | ICD-10-CM | POA: Diagnosis not present

## 2017-06-17 DIAGNOSIS — J342 Deviated nasal septum: Secondary | ICD-10-CM | POA: Diagnosis not present

## 2017-06-17 DIAGNOSIS — R519 Headache, unspecified: Secondary | ICD-10-CM

## 2017-07-12 ENCOUNTER — Other Ambulatory Visit: Payer: Self-pay | Admitting: Family Medicine

## 2017-07-12 ENCOUNTER — Ambulatory Visit
Admission: RE | Admit: 2017-07-12 | Discharge: 2017-07-12 | Disposition: A | Discharge status: Home / Self Care | Payer: 59 | Source: Ambulatory Visit | Attending: Family Medicine | Admitting: Family Medicine

## 2017-07-12 ENCOUNTER — Encounter: Payer: Self-pay | Admitting: Family Medicine

## 2017-07-12 DIAGNOSIS — R922 Inconclusive mammogram: Secondary | ICD-10-CM | POA: Diagnosis not present

## 2017-07-12 DIAGNOSIS — N6310 Unspecified lump in the right breast, unspecified quadrant: Secondary | ICD-10-CM | POA: Diagnosis not present

## 2018-01-07 DIAGNOSIS — Z23 Encounter for immunization: Secondary | ICD-10-CM | POA: Diagnosis not present

## 2018-02-17 ENCOUNTER — Other Ambulatory Visit: Payer: Self-pay | Admitting: Family Medicine

## 2018-02-17 DIAGNOSIS — Z1231 Encounter for screening mammogram for malignant neoplasm of breast: Secondary | ICD-10-CM

## 2018-03-14 DIAGNOSIS — Z6820 Body mass index (BMI) 20.0-20.9, adult: Secondary | ICD-10-CM | POA: Diagnosis not present

## 2018-03-14 DIAGNOSIS — Z124 Encounter for screening for malignant neoplasm of cervix: Secondary | ICD-10-CM | POA: Diagnosis not present

## 2018-03-14 DIAGNOSIS — Z01419 Encounter for gynecological examination (general) (routine) without abnormal findings: Secondary | ICD-10-CM | POA: Diagnosis not present

## 2018-03-27 ENCOUNTER — Ambulatory Visit
Admission: RE | Admit: 2018-03-27 | Discharge: 2018-03-27 | Disposition: A | Discharge status: Home / Self Care | Payer: 59 | Source: Ambulatory Visit | Attending: Family Medicine | Admitting: Family Medicine

## 2018-03-27 DIAGNOSIS — Z1231 Encounter for screening mammogram for malignant neoplasm of breast: Secondary | ICD-10-CM

## 2018-03-28 DIAGNOSIS — Z30430 Encounter for insertion of intrauterine contraceptive device: Secondary | ICD-10-CM | POA: Diagnosis not present

## 2018-03-28 DIAGNOSIS — Z30432 Encounter for removal of intrauterine contraceptive device: Secondary | ICD-10-CM | POA: Diagnosis not present

## 2018-05-09 ENCOUNTER — Ambulatory Visit (INDEPENDENT_AMBULATORY_CARE_PROVIDER_SITE_OTHER): Payer: 59 | Admitting: Family Medicine

## 2018-05-09 ENCOUNTER — Encounter: Payer: Self-pay | Admitting: Family Medicine

## 2018-05-09 VITALS — BP 114/78 | HR 95 | Temp 97.7°F | Resp 16 | Ht 61.75 in | Wt 113.8 lb

## 2018-05-09 DIAGNOSIS — F418 Other specified anxiety disorders: Secondary | ICD-10-CM

## 2018-05-09 DIAGNOSIS — R002 Palpitations: Secondary | ICD-10-CM | POA: Diagnosis not present

## 2018-05-09 MED ORDER — LORAZEPAM 0.5 MG PO TABS
ORAL_TABLET | ORAL | 1 refills | Status: DC
Start: 2018-05-09 — End: 2019-02-23

## 2018-05-09 MED ORDER — METOPROLOL SUCCINATE ER 25 MG PO TB24: 50.0000 mg | ORAL_TABLET | Freq: Every day | ORAL | 3 refills | Status: DC

## 2018-05-09 NOTE — Progress Notes (Signed)
OFFICE VISIT  05/26/2018   CC:  Chief Complaint  Patient presents with  . Follow-up    RCI, pt is not fasting    HPI:    Patient is a 43 y.o.  female who presents for f/u hx of tachypalpitations and anxiety. Stopped taking metoprolol regularly b/c wanted to see how she would do off of it. Has recently noted more palpitations, especially with excessive stress/anxiety in her life recently. Problems sleeping x 1-2 mo, worse the last week or so. No alcohol or drugs.   Has been out of her lorazepam for about the last 5 months.   In review of PMP Aware today, last fill of lorazepam was 06/14/2017. The fill prior to that was about 1 yr prior. Each time only 30 tabs were dispensed, with no RFs.  She obviously uses this med sparingly/responsibly.  Past Medical History:  Diagnosis Date  . Abnormal mammogram of right breast 01/03/2017   diagnostic mammo and u/s showed 2 benign-appearing lesions.  F/u diagnostic mammo and u/s of R breast 07/12/17 showed resolution of the 2 lesions.  Rpt mammo recommended 6 mo -->NORMAL 03/27/2018-->repeat 1 yr.  . Asthma   . Hay fever   . SVT (supraventricular tachycardia) (HCC)    +tachypalpitations    Past Surgical History:  Procedure Laterality Date  . AUGMENTATION MAMMAPLASTY Bilateral    Saline, Retropec   . DILATION AND CURETTAGE OF UTERUS    . TONSILLECTOMY      Outpatient Medications Prior to Visit  Medication Sig Dispense Refill  . naproxen (NAPROSYN) 500 MG tablet 1 tab po bid prn with food 60 tablet 5  . metoprolol succinate (TOPROL-XL) 25 MG 24 hr tablet Take 2 tablets by mouth  daily 180 tablet 3  . amoxicillin-clavulanate (AUGMENTIN) 875-125 MG tablet Take 1 tablet by mouth 2 (two) times daily. (Patient not taking: Reported on 05/09/2018) 20 tablet 0  . mupirocin ointment (BACTROBAN) 2 % Apply 1 application topically 3 (three) times daily. (Patient not taking: Reported on 05/09/2018) 15 g 0  . LORazepam (ATIVAN) 0.5 MG tablet 1-2 tabs po tid  prn anxiety (Patient not taking: Reported on 05/09/2018) 30 tablet 0   No facility-administered medications prior to visit.     No Known Allergies  ROS As per HPI  PE: Blood pressure 114/78, pulse 95, temperature 97.7 F (36.5 C), temperature source Oral, resp. rate 16, height 5' 1.75" (1.568 m), weight 113 lb 12.8 oz (51.6 kg), SpO2 100 %. Body mass index is 20.98 kg/m.  Gen: Alert, well appearing.  Patient is oriented to person, place, time, and situation. AFFECT: pleasant, lucid thought and speech. CV: RRR, no m/r/g.   LUNGS: CTA bilat, nonlabored resps, good aeration in all lung fields. EXT: no clubbing or cyanosis.  no edema.    LABS:    Chemistry      Component Value Date/Time   NA 139 04/12/2015 1037   K 4.6 04/12/2015 1037   CL 103 04/12/2015 1037   CO2 31 04/12/2015 1037   BUN 12 04/12/2015 1037   CREATININE 0.65 04/12/2015 1037      Component Value Date/Time   CALCIUM 9.3 04/12/2015 1037   ALKPHOS 67 04/12/2015 1037   AST 18 04/12/2015 1037   ALT 12 04/12/2015 1037   BILITOT 0.8 04/12/2015 1037     Lab Results  Component Value Date   WBC 4.8 04/12/2015   HGB 12.9 04/12/2015   HCT 39.6 04/12/2015   MCV 92.6 04/12/2015  PLT 210.0 04/12/2015   Lab Results  Component Value Date   TSH 1.08 04/12/2015    IMPRESSION AND PLAN:  1) Palpitations: restart toprol xl 25mg  qd. Avoid caffeine and otc decongestants.  Avoid alcohol.  2) Situational anxiety: uses lorazepam responsibly. Will eRx lorazepam 0.5mg  today, 1 tab bid prn, #60, RF x 1.  An After Visit Summary was printed and given to the patient.  FOLLOW UP: Return in about 6 months (around 11/09/2018) for routine chronic illness f/u.  Signed:  Santiago Bumpers, MD           05/26/2018

## 2018-06-25 ENCOUNTER — Other Ambulatory Visit: Payer: Self-pay | Admitting: Family Medicine

## 2018-08-13 ENCOUNTER — Other Ambulatory Visit: Payer: Self-pay | Admitting: Family Medicine

## 2019-02-23 ENCOUNTER — Encounter: Payer: Self-pay | Admitting: Family Medicine

## 2019-02-23 ENCOUNTER — Ambulatory Visit (INDEPENDENT_AMBULATORY_CARE_PROVIDER_SITE_OTHER): Payer: 59 | Admitting: Family Medicine

## 2019-02-23 ENCOUNTER — Other Ambulatory Visit: Payer: Self-pay

## 2019-02-23 VITALS — Wt 109.0 lb

## 2019-02-23 DIAGNOSIS — F411 Generalized anxiety disorder: Secondary | ICD-10-CM | POA: Diagnosis not present

## 2019-02-23 DIAGNOSIS — G4709 Other insomnia: Secondary | ICD-10-CM | POA: Diagnosis not present

## 2019-02-23 DIAGNOSIS — R002 Palpitations: Secondary | ICD-10-CM

## 2019-02-23 MED ORDER — LORAZEPAM 0.5 MG PO TABS: ORAL_TABLET | ORAL | 1 refills | Status: DC

## 2019-02-23 MED ORDER — METOPROLOL SUCCINATE ER 25 MG PO TB24: 50.0000 mg | ORAL_TABLET | Freq: Every day | ORAL | 3 refills | Status: DC

## 2019-02-23 NOTE — Progress Notes (Signed)
Virtual Visit via Video Note  I connected with pt on 02/23/19 at  3:00 PM EST by a video enabled telemedicine application and verified that I am speaking with the correct person using two identifiers.  Location patient: home Location provider:work or home office Persons participating in the virtual visit: patient, provider  I discussed the limitations of evaluation and management by telemedicine and the availability of in person appointments. The patient expressed understanding and agreed to proceed.  Telemedicine visit is a necessity given the COVID-19 restrictions in place at the current time.  HPI: 43 y/o WF being seen today for f/u hx of tachypalpitations and GAD/situational anxiety. A/P as of last f/u visit 05/09/18:   "1) Palpitations: restart toprol xl 25mg  qd. Avoid caffeine and otc decongestants.  Avoid alcohol.  2) Situational anxiety: uses lorazepam responsibly. Will eRx lorazepam 0.5mg  today, 1 tab bid prn, #60, RF x 1."   Interim hx:  Doing well, walking/working out regularly. One son in college, 1 daughter in HS. She has some palpitations still but admits she doesn't take her toprol much at all. They seem to be brief and unassociated with any identifiable trigger.  No dizziness, CP, SOB, or DOE associated with them.  Uses lorazepam most nights for anxiety-related probs with sleep initiation, and only occasionally for daytime anxiety. It does make her sleepy so she is careful with daytime use.  PMP AWARE reviewed today: most recent rx for lorazepam 0.5mg  was filled 05/09/18, # 60, rx by me. No red flags.  ROS: no fevers, cough, nasal cong, or SOB  Past Medical History:  Diagnosis Date  . Abnormal mammogram of right breast 01/03/2017   diagnostic mammo and u/s showed 2 benign-appearing lesions.  F/u diagnostic mammo and u/s of R breast 07/12/17 showed resolution of the 2 lesions.  Rpt mammo recommended 6 mo -->NORMAL 03/27/2018-->repeat 1 yr.  . Asthma   . Hay fever    . SVT (supraventricular tachycardia) (HCC)    +tachypalpitations    Past Surgical History:  Procedure Laterality Date  . AUGMENTATION MAMMAPLASTY Bilateral    Saline, Retropec   . DILATION AND CURETTAGE OF UTERUS    . TONSILLECTOMY      Family History  Problem Relation Age of Onset  . Diabetes Father   . Hypertension Maternal Grandmother   . Hypertension Maternal Grandfather     SOCIAL HX:  Social History   Socioeconomic History  . Marital status: Married    Spouse name: Not on file  . Number of children: Not on file  . Years of education: Not on file  . Highest education level: Not on file  Occupational History  . Not on file  Tobacco Use  . Smoking status: Never Smoker  . Smokeless tobacco: Never Used  Substance and Sexual Activity  . Alcohol use: Yes  . Drug use: No  . Sexual activity: Yes    Birth control/protection: None  Other Topics Concern  . 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   Has lived in [place] since 2004.   No T/A/Ds.   Exercise: walks 2-3 miles per day, runs some, P90 X.   Regular american diet.   Social Determinants of Health   Financial Resource Strain:   . Difficulty of Paying Living Expenses: Not on file  Food Insecurity:   . Worried About 2005 of  Food in the Last Year: Not on file  . Ran Out of Food in the Last Year: Not on file  Transportation Needs:   . Lack of Transportation (Medical): Not on file  . Lack of Transportation (Non-Medical): Not on file  Physical Activity:   . Days of Exercise per Week: Not on file  . Minutes of Exercise per Session: Not on file  Stress:   . Feeling of Stress : Not on file  Social Connections:   . Frequency of Communication with Friends and Family: Not on file  . Frequency of Social Gatherings with Friends and Family: Not on file  . Attends Religious Services: Not on file  . Active Member of Clubs or  Organizations: Not on file  . Attends Archivist Meetings: Not on file  . Marital Status: Not on file      Current Outpatient Medications:  .  LORazepam (ATIVAN) 0.5 MG tablet, 1 tab po bid prn anxiety, Disp: 60 tablet, Rfl: 1 .  metoprolol succinate (TOPROL-XL) 25 MG 24 hr tablet, Take 2 tablets (50 mg total) by mouth daily. (Patient not taking: Reported on 02/23/2019), Disp: 180 tablet, Rfl: 3 .  naproxen (NAPROSYN) 500 MG tablet, 1 tab po bid prn with food (Patient not taking: Reported on 02/23/2019), Disp: 60 tablet, Rfl: 5  EXAM:  VITALS per patient if applicable: Wt 109 lb (49.4 kg)   BMI 20.10 kg/m    GENERAL: alert, oriented, appears well and in no acute distress  HEENT: atraumatic, conjunttiva clear, no obvious abnormalities on inspection of external nose and ears  NECK: normal movements of the head and neck  LUNGS: on inspection no signs of respiratory distress, breathing rate appears normal, no obvious gross SOB, gasping or wheezing  CV: no obvious cyanosis  MS: moves all visible extremities without noticeable abnormality  PSYCH/NEURO: pleasant and cooperative, no obvious depression or anxiety, speech and thought processing grossly intact  LABS: none today    Chemistry      Component Value Date/Time   NA 139 04/12/2015 1037   K 4.6 04/12/2015 1037   CL 103 04/12/2015 1037   CO2 31 04/12/2015 1037   BUN 12 04/12/2015 1037   CREATININE 0.65 04/12/2015 1037      Component Value Date/Time   CALCIUM 9.3 04/12/2015 1037   ALKPHOS 67 04/12/2015 1037   AST 18 04/12/2015 1037   ALT 12 04/12/2015 1037   BILITOT 0.8 04/12/2015 1037     Lab Results  Component Value Date   CHOL 174 04/12/2015   HDL 57.60 04/12/2015   LDLCALC 104 (H) 04/12/2015   TRIG 64.0 04/12/2015   CHOLHDL 3 04/12/2015   Lab Results  Component Value Date   WBC 4.8 04/12/2015   HGB 12.9 04/12/2015   HCT 39.6 04/12/2015   MCV 92.6 04/12/2015   PLT 210.0 04/12/2015   Lab  Results  Component Value Date   TSH 1.08 04/12/2015    ASSESSMENT AND PLAN:  Discussed the following assessment and plan:  1) Palpitations: only mildly bothersome, brief. She has toprol to use and this works but it happens infrequently enough that she usually chooses not to take it. Minimize caffeine. Continue exercising.  2) GAD/with anxiety-related insomnia: she uses non-med coping mechanisms well, esp exercising. Using lorazepam responsibly. I did electronic rx's for lorazepam 0.5mg , 1 bid prn, #60, with RF x 1 today for.    I discussed the assessment and treatment plan with the patient. The patient was provided  an opportunity to ask questions and all were answered. The patient agreed with the plan and demonstrated an understanding of the instructions.   The patient was advised to call back or seek an in-person evaluation if the symptoms worsen or if the condition fails to improve as anticipated.  F/u: 6 mo  Signed:  Santiago BumpersPhil Keiondre Colee, MD           02/23/2019

## 2019-03-18 ENCOUNTER — Telehealth: Payer: Self-pay

## 2019-03-18 MED ORDER — ALPRAZOLAM 0.5 MG PO TABS: ORAL_TABLET | ORAL | 1 refills | Status: DC

## 2019-03-18 NOTE — Telephone Encounter (Signed)
OK, alprazolam eRx'd. Make pt aware that this medication could also make her tired/sleepy.  She may even want to try taking a 1/2 tab for the first 1 or 2 times she takes it to see if the sleepiness effect is any different. Pls cancel any remaining lorazepam RFs at the pharmacy.

## 2019-03-18 NOTE — Telephone Encounter (Signed)
Patient had recent visit on 02/23/19 for GAD f/u. She would like to know if something else can be sent instead of Lorazepam. She has been feeling more sleepy while taking Lorazepam.  Please advise, thanks.

## 2019-03-18 NOTE — Telephone Encounter (Signed)
Patient would like to take something like Xanax instead of Lorazepam. Patient feels like the Lorazepam makes her sleepy. She uses [facility]

## 2019-03-19 NOTE — Telephone Encounter (Signed)
Patient advised and voiced understanding.  

## 2019-03-19 NOTE — Telephone Encounter (Signed)
LM for pt to return call. Called pharmacy and cancelled 1 refill left on Lorazepam.

## 2019-05-18 ENCOUNTER — Other Ambulatory Visit: Payer: Self-pay | Admitting: Family Medicine

## 2019-05-18 DIAGNOSIS — Z1231 Encounter for screening mammogram for malignant neoplasm of breast: Secondary | ICD-10-CM

## 2019-06-10 ENCOUNTER — Ambulatory Visit: Payer: 59

## 2019-07-20 ENCOUNTER — Other Ambulatory Visit: Payer: Self-pay

## 2019-07-20 ENCOUNTER — Ambulatory Visit
Admission: RE | Admit: 2019-07-20 | Discharge: 2019-07-20 | Disposition: A | Discharge status: Home / Self Care | Payer: 59 | Source: Ambulatory Visit | Attending: Family Medicine | Admitting: Family Medicine

## 2019-07-20 DIAGNOSIS — Z1231 Encounter for screening mammogram for malignant neoplasm of breast: Secondary | ICD-10-CM

## 2019-11-12 ENCOUNTER — Telehealth (INDEPENDENT_AMBULATORY_CARE_PROVIDER_SITE_OTHER): Payer: 59 | Admitting: Family Medicine

## 2019-11-12 ENCOUNTER — Telehealth: Payer: Self-pay

## 2019-11-12 ENCOUNTER — Other Ambulatory Visit: Payer: Self-pay | Admitting: Family Medicine

## 2019-11-12 ENCOUNTER — Encounter: Payer: Self-pay | Admitting: Family Medicine

## 2019-11-12 DIAGNOSIS — L03011 Cellulitis of right finger: Secondary | ICD-10-CM

## 2019-11-12 MED ORDER — MUPIROCIN 2 % EX OINT: TOPICAL_OINTMENT | CUTANEOUS | 0 refills | Status: DC

## 2019-11-12 MED ORDER — CEPHALEXIN 500 MG PO CAPS: 500.0000 mg | ORAL_CAPSULE | Freq: Three times a day (TID) | ORAL | 0 refills | Status: DC

## 2019-11-12 NOTE — Telephone Encounter (Signed)
Pt called triage for finger pain. Pt has possible infected finger with throbbing, redness, pain. Pt was scheduled for VV with Dr. Kriste Basque 11/12/19

## 2019-11-12 NOTE — Patient Instructions (Signed)
-  I sent the medication(s) we discussed to your pharmacy: Meds ordered this encounter  Medications  . cephALEXin (KEFLEX) 500 MG capsule    Sig: Take 1 capsule (500 mg total) by mouth 3 (three) times daily.    Dispense:  15 capsule    Refill:  0  . mupirocin ointment (BACTROBAN) 2 %    Sig: Apply small amount twice daily to the affected area.    Dispense:  22 g    Refill:  0    Keep hands clean and dry and avoid dirt and dish water, etc.  Soak for 5 minutes in warm soapy or warm salty water twice daily and then dry thoroughly and apply the ointment.  I hope you are feeling better soon! Seek in person medical evaluation promptly if your symptoms worsen, new concerns arise or you are not improving with treatment over the next few days.

## 2019-11-12 NOTE — Progress Notes (Signed)
Virtual Visit via Video Note  I connected with Crystal Friedman  on 11/12/19 at 12:00 PM EDT by a video enabled telemedicine application and verified that I am speaking with the correct person using two identifiers.  Location patient: home Location provider:work or home office Persons participating in the virtual visit: patient, provider  I discussed the limitations of evaluation and management by telemedicine and the availability of in person appointments. The patient expressed understanding and agreed to proceed.   HPI:  Acute visit for finger pain: -started yesterday -pain, redness, swelling around the nail of the 4th digit on the R hand -denies spreading up hand, purulence, fevers, malaise or known trauma   ROS: See pertinent positives and negatives per HPI.  Past Medical History:  Diagnosis Date  . Abnormal mammogram of right breast 01/03/2017   diagnostic mammo and u/s showed 2 benign-appearing lesions.  F/u diagnostic mammo and u/s of R breast 07/12/17 showed resolution of the 2 lesions.  Rpt mammo recommended 6 mo -->NORMAL 03/27/2018-->repeat 1 yr.  . Asthma   . Hay fever   . SVT (supraventricular tachycardia) (HCC)    +tachypalpitations    Past Surgical History:  Procedure Laterality Date  . AUGMENTATION MAMMAPLASTY Bilateral    Saline, Retropec   . DILATION AND CURETTAGE OF UTERUS    . TONSILLECTOMY      Family History  Problem Relation Age of Onset  . Diabetes Father   . Hypertension Maternal Grandmother   . Hypertension Maternal Grandfather     SOCIAL HX: see hpi   Current Outpatient Medications:  .  metoprolol succinate (TOPROL-XL) 25 MG 24 hr tablet, Take 2 tablets (50 mg total) by mouth daily., Disp: 180 tablet, Rfl: 3 .  cephALEXin (KEFLEX) 500 MG capsule, Take 1 capsule (500 mg total) by mouth 3 (three) times daily., Disp: 15 capsule, Rfl: 0 .  mupirocin ointment (BACTROBAN) 2 %, Apply small amount twice daily to the affected area., Disp: 22 g, Rfl:  0  EXAM:  VITALS per patient if applicable:  GENERAL: alert, oriented, appears well and in no acute distress  HEENT: atraumatic, conjunttiva clear, no obvious abnormalities on inspection of external nose and ears  NECK: normal movements of the head and neck  LUNGS: on inspection no signs of respiratory distress, breathing rate appears normal, no obvious gross SOB, gasping or wheezing  CV: no obvious cyanosis  SKIN: limited exam due to video quality - however can sees some erythema around the nail of the 4th digit R hand with, query mild edema in this area, lighter surrounding erythema in the distal finger.  MS: moves all visible extremities without noticeable abnormality  PSYCH/NEURO: pleasant and cooperative, no obvious depression or anxiety, speech and thought processing grossly intact  ASSESSMENT AND PLAN:  Discussed the following assessment and plan:  Paronychia of finger, right  Cellulitis of finger of right hand  -we discussed possible serious and likely etiologies, options for evaluation and workup, limitations of telemedicine visit vs in person visit, treatment, treatment risks and precautions. Pt prefers to treat via telemedicine empirically rather then risking or undertaking an in person visit at this moment. Explained exam limited due to video quality - she does prefer to avoid inperson visit. Let her know there could be fluctuance that I can not see and that surgical management is sometimes needed for that. She would prefer to try abx and soaks. Opted for topical mupirocin oint bid, keflex and soapy or salt soaks twice daily, keeping hands clean and  dry. Advised of potential complications and advised to seek prompt follow up in person care if worsening, new symptoms arise, or if is not improving with treatment.    I discussed the assessment and treatment plan with the patient. The patient was provided an opportunity to ask questions and all were answered. The patient  agreed with the plan and demonstrated an understanding of the instructions.      Terressa Koyanagi, DO

## 2019-12-04 ENCOUNTER — Ambulatory Visit (INDEPENDENT_AMBULATORY_CARE_PROVIDER_SITE_OTHER): Payer: 59 | Admitting: Family Medicine

## 2019-12-04 ENCOUNTER — Other Ambulatory Visit: Payer: Self-pay

## 2019-12-04 ENCOUNTER — Encounter: Payer: Self-pay | Admitting: Family Medicine

## 2019-12-04 VITALS — BP 124/83 | HR 79 | Temp 98.3°F | Resp 18 | Ht 61.0 in | Wt 117.6 lb

## 2019-12-04 DIAGNOSIS — I471 Supraventricular tachycardia: Secondary | ICD-10-CM | POA: Diagnosis not present

## 2019-12-04 DIAGNOSIS — Z0000 Encounter for general adult medical examination without abnormal findings: Secondary | ICD-10-CM | POA: Diagnosis not present

## 2019-12-04 DIAGNOSIS — Z23 Encounter for immunization: Secondary | ICD-10-CM | POA: Diagnosis not present

## 2019-12-04 LAB — CBC WITH DIFFERENTIAL/PLATELET
Basophils Absolute: 0 10*3/uL (ref 0.0–0.1)
Basophils Relative: 0.3 % (ref 0.0–3.0)
Eosinophils Absolute: 0.1 10*3/uL (ref 0.0–0.7)
Eosinophils Relative: 2.2 % (ref 0.0–5.0)
HCT: 39.4 % (ref 36.0–46.0)
Hemoglobin: 13.1 g/dL (ref 12.0–15.0)
Lymphocytes Relative: 21.1 % (ref 12.0–46.0)
Lymphs Abs: 1.3 10*3/uL (ref 0.7–4.0)
MCHC: 33.2 g/dL (ref 30.0–36.0)
MCV: 94 fl (ref 78.0–100.0)
Monocytes Absolute: 0.4 10*3/uL (ref 0.1–1.0)
Monocytes Relative: 7.1 % (ref 3.0–12.0)
Neutro Abs: 4.2 10*3/uL (ref 1.4–7.7)
Neutrophils Relative %: 69.3 % (ref 43.0–77.0)
Platelets: 229 10*3/uL (ref 150.0–400.0)
RBC: 4.19 Mil/uL (ref 3.87–5.11)
RDW: 12.7 % (ref 11.5–15.5)
WBC: 6 10*3/uL (ref 4.0–10.5)

## 2019-12-04 LAB — LIPID PANEL
Cholesterol: 185 mg/dL (ref 0–200)
HDL: 61.5 mg/dL (ref >39.00)
LDL Cholesterol: 107 mg/dL — ABNORMAL HIGH (ref 0–99)
NonHDL: 123.08
Total CHOL/HDL Ratio: 3
Triglycerides: 78 mg/dL (ref 0.0–149.0)
VLDL: 15.6 mg/dL (ref 0.0–40.0)

## 2019-12-04 LAB — COMPREHENSIVE METABOLIC PANEL
ALT: 11 U/L (ref 0–35)
AST: 17 U/L (ref 0–37)
Albumin: 4.5 g/dL (ref 3.5–5.2)
Alkaline Phosphatase: 61 U/L (ref 39–117)
BUN: 9 mg/dL (ref 6–23)
CO2: 27 mEq/L (ref 19–32)
Calcium: 9.1 mg/dL (ref 8.4–10.5)
Chloride: 105 mEq/L (ref 96–112)
Creatinine, Ser: 0.66 mg/dL (ref 0.40–1.20)
GFR: 96.9 mL/min (ref >60.00)
Glucose, Bld: 76 mg/dL (ref 70–99)
Potassium: 4 mEq/L (ref 3.5–5.1)
Sodium: 139 mEq/L (ref 135–145)
Total Bilirubin: 0.7 mg/dL (ref 0.2–1.2)
Total Protein: 7 g/dL (ref 6.0–8.3)

## 2019-12-04 LAB — TSH: TSH: 1.68 u[IU]/mL (ref 0.35–4.50)

## 2019-12-04 MED ORDER — LORAZEPAM 0.5 MG PO TABS
0.5000 mg | ORAL_TABLET | Freq: Two times a day (BID) | ORAL | 1 refills | Status: AC | PRN
Start: 2019-12-04 — End: ?

## 2019-12-04 NOTE — Progress Notes (Signed)
Office Note 12/04/2019  CC:  Chief Complaint  Patient presents with  . Annual Exam    doing well , no complaints    HPI:  Crystal Friedman is a 44 y.o. White female who is here for annual health maintenance exam.   Past Medical History:  Diagnosis Date  . Abnormal mammogram of right breast 01/03/2017   diagnostic mammo and u/s showed 2 benign-appearing lesions.  F/u diagnostic mammo and u/s of R breast 07/12/17 showed resolution of the 2 lesions.  Rpt mammo recommended 6 mo -->NORMAL 03/27/2018-->repeat 1 yr.  . Asthma   . Hay fever   . SVT (supraventricular tachycardia) (HCC)    +tachypalpitations    Past Surgical History:  Procedure Laterality Date  . AUGMENTATION MAMMAPLASTY Bilateral    Saline, Retropec   . DILATION AND CURETTAGE OF UTERUS    . TONSILLECTOMY      Family History  Problem Relation Age of Onset  . Diabetes Father   . Hypertension Maternal Grandmother   . Hypertension Maternal Grandfather     Social History   Socioeconomic History  . Marital status: Married    Spouse name: Not on file  . Number of children: Not on file  . Years of education: Not on file  . Highest education level: Not on file  Occupational History  . Not on file  Tobacco Use  . Smoking status: Never Smoker  . Smokeless tobacco: Never Used  Substance and Sexual Activity  . Alcohol use: Yes  . Drug use: No  . Sexual activity: Yes    Birth control/protection: None  Other Topics Concern  . 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   Has lived in [place] since 2004.   No T/A/Ds.   Exercise: walks 2-3 miles per day, runs some, P90 X.   Regular american diet.   Social Determinants of Health   Financial Resource Strain:   . Difficulty of Paying Living Expenses: Not on file  Food Insecurity:   . Worried About Programme researcher, broadcasting/film/video in the Last Year: Not on file  . Ran Out of Food in the  Last Year: Not on file  Transportation Needs:   . Lack of Transportation (Medical): Not on file  . Lack of Transportation (Non-Medical): Not on file  Physical Activity:   . Days of Exercise per Week: Not on file  . Minutes of Exercise per Session: Not on file  Stress:   . Feeling of Stress : Not on file  Social Connections:   . Frequency of Communication with Friends and Family: Not on file  . Frequency of Social Gatherings with Friends and Family: Not on file  . Attends Religious Services: Not on file  . Active Member of Clubs or Organizations: Not on file  . Attends Banker Meetings: Not on file  . Marital Status: Not on file  Intimate Partner Violence:   . Fear of Current or Ex-Partner: Not on file  . Emotionally Abused: Not on file  . Physically Abused: Not on file  . Sexually Abused: Not on file    Outpatient Medications Prior to Visit  Medication Sig Dispense Refill  . metoprolol succinate (TOPROL-XL) 25 MG 24 hr tablet Take 2 tablets (50 mg total) by mouth daily. 180 tablet 3  . cephALEXin (KEFLEX) 500 MG capsule Take 1 capsule (500  mg total) by mouth 3 (three) times daily. (Patient not taking: Reported on 12/04/2019) 15 capsule 0  . mupirocin ointment (BACTROBAN) 2 % Apply small amount twice daily to the affected area. (Patient not taking: Reported on 12/04/2019) 22 g 0   No facility-administered medications prior to visit.    No Known Allergies  ROS Review of Systems  Constitutional: Negative for appetite change, chills, fatigue and fever.  HENT: Negative for congestion, dental problem, ear pain and sore throat.   Eyes: Negative for discharge, redness and visual disturbance.  Respiratory: Negative for cough, chest tightness, shortness of breath and wheezing.   Cardiovascular: Negative for chest pain, palpitations and leg swelling.  Gastrointestinal: Negative for abdominal pain, blood in stool, diarrhea, nausea and vomiting.  Genitourinary: Negative for  difficulty urinating, dysuria, flank pain, frequency, hematuria and urgency.  Musculoskeletal: Positive for arthralgias (recent knee contusion). Negative for back pain, joint swelling, myalgias and neck stiffness.  Skin: Negative for pallor and rash.  Neurological: Negative for dizziness, speech difficulty, weakness and headaches.  Hematological: Negative for adenopathy. Does not bruise/bleed easily.  Psychiatric/Behavioral: Negative for confusion and sleep disturbance. The patient is not nervous/anxious.     PE; Vitals with BMI 12/04/2019 02/23/2019 05/09/2018  Height 5\' 1"  - 5' 1.75"  Weight 117 lbs 10 oz 109 lbs 113 lbs 13 oz  BMI 22.23 - 21  Systolic 124 - 114  Diastolic 83 - 78  Pulse 79 - 95    Female CMA chaperoned exam today. Gen: Alert, well appearing.  Patient is oriented to person, place, time, and situation. AFFECT: pleasant, lucid thought and speech. ENT: Ears: EACs clear, normal epithelium.  TMs with good light reflex and landmarks bilaterally.  Eyes: no injection, icteris, swelling, or exudate.  EOMI, PERRLA. Nose: no drainage or turbinate edema/swelling.  No injection or focal lesion.  Mouth: lips without lesion/swelling.  Oral mucosa pink and moist.  Dentition intact and without obvious caries or gingival swelling.  Oropharynx without erythema, exudate, or swelling.  Neck: supple/nontender.  No LAD, mass, or TM.  Carotid pulses 2+ bilaterally, without bruits. CV: RRR, no m/r/g.   LUNGS: CTA bilat, nonlabored resps, good aeration in all lung fields. ABD: soft, NT, ND, BS normal.  No hepatospenomegaly or mass.  No bruits. EXT: no clubbing, cyanosis, or edema.  R knee TTP over patella, otherwise nontender.  Minimal swelling over patella, if any.  No warmth or erythema.  Small abrasion.  ROM intact, with mild pain with full flexion. Musculoskeletal: no joint swelling, erythema, warmth, or tenderness.  ROM of all joints intact. Skin - no sores or suspicious lesions or rashes or  color changes   Pertinent labs:  Lab Results  Component Value Date   TSH 1.08 04/12/2015   Lab Results  Component Value Date   WBC 4.8 04/12/2015   HGB 12.9 04/12/2015   HCT 39.6 04/12/2015   MCV 92.6 04/12/2015   PLT 210.0 04/12/2015   Lab Results  Component Value Date   CREATININE 0.65 04/12/2015   BUN 12 04/12/2015   NA 139 04/12/2015   K 4.6 04/12/2015   CL 103 04/12/2015   CO2 31 04/12/2015   Lab Results  Component Value Date   ALT 12 04/12/2015   AST 18 04/12/2015   ALKPHOS 67 04/12/2015   BILITOT 0.8 04/12/2015   Lab Results  Component Value Date   CHOL 174 04/12/2015   Lab Results  Component Value Date   HDL 57.60 04/12/2015   Lab  Results  Component Value Date   LDLCALC 104 (H) 04/12/2015   Lab Results  Component Value Date   TRIG 64.0 04/12/2015   Lab Results  Component Value Date   CHOLHDL 3 04/12/2015   ASSESSMENT AND PLAN:   Health maintenance exam: Reviewed age and gender appropriate health maintenance issues (prudent diet, regular exercise, health risks of tobacco and excessive alcohol, use of seatbelts, fire alarms in home, use of sunscreen).  Also reviewed age and gender appropriate health screening as well as vaccine recommendations. Vaccines: Tdap UTD.  Flu->given today.  Covid 19->UTD. Labs: fasting HP labs ordered. Cervical ca screening: per Dr. Pamalee Leyden MD.  Next pap 2023. Breast ca screening: hx of abnormal mammo, f/u imaging reassuring, no hx of bx, next mammo due 07/2020. Colon ca screening: average risk patient= as per latest guidelines, start screening at 41 yrs of age (she turns 44 in about 6 wks).  Right patella contusion/prepatellar bursitis---reassured, gradual resolution expected, recommended ice and ROM.  Anxiety: very occasional elevated anxiety that has required lorazepam and it has helped well in the past. She takes approx 2 per month.  I did rx for loraz 0.5mg , 1 bid prn, #60, no RF.  An After Visit Summary was  printed and given to the patient.  FOLLOW UP:  Return in about 1 year (around 12/03/2020) for routine chronic illness f/u.  Signed:  Santiago Bumpers, MD           12/04/2019

## 2019-12-04 NOTE — Patient Instructions (Signed)
Health Maintenance, Female Adopting a healthy lifestyle and getting preventive care are important in promoting health and wellness. Ask your health care provider about:  The right schedule for you to have regular tests and exams.  Things you can do on your own to prevent diseases and keep yourself healthy. What should I know about diet, weight, and exercise? Eat a healthy diet   Eat a diet that includes plenty of vegetables, fruits, low-fat dairy products, and lean protein.  Do not eat a lot of foods that are high in solid fats, added sugars, or sodium. Maintain a healthy weight Body mass index (BMI) is used to identify weight problems. It estimates body fat based on height and weight. Your health care provider can help determine your BMI and help you achieve or maintain a healthy weight. Get regular exercise Get regular exercise. This is one of the most important things you can do for your health. Most adults should:  Exercise for at least 150 minutes each week. The exercise should increase your heart rate and make you sweat (moderate-intensity exercise).  Do strengthening exercises at least twice a week. This is in addition to the moderate-intensity exercise.  Spend less time sitting. Even light physical activity can be beneficial. Watch cholesterol and blood lipids Have your blood tested for lipids and cholesterol at 45 years of age, then have this test every 5 years. Have your cholesterol levels checked more often if:  Your lipid or cholesterol levels are high.  You are older than 45 years of age.  You are at high risk for heart disease. What should I know about cancer screening? Depending on your health history and family history, you may need to have cancer screening at various ages. This may include screening for:  Breast cancer.  Cervical cancer.  Colorectal cancer.  Skin cancer.  Lung cancer. What should I know about heart disease, diabetes, and high blood  pressure? Blood pressure and heart disease  High blood pressure causes heart disease and increases the risk of stroke. This is more likely to develop in people who have high blood pressure readings, are of African descent, or are overweight.  Have your blood pressure checked: ? Every 3-5 years if you are 18-39 years of age. ? Every year if you are 40 years old or older. Diabetes Have regular diabetes screenings. This checks your fasting blood sugar level. Have the screening done:  Once every three years after age 40 if you are at a normal weight and have a low risk for diabetes.  More often and at a younger age if you are overweight or have a high risk for diabetes. What should I know about preventing infection? Hepatitis B If you have a higher risk for hepatitis B, you should be screened for this virus. Talk with your health care provider to find out if you are at risk for hepatitis B infection. Hepatitis C Testing is recommended for:  Everyone born from 1945 through 1965.  Anyone with known risk factors for hepatitis C. Sexually transmitted infections (STIs)  Get screened for STIs, including gonorrhea and chlamydia, if: ? You are sexually active and are younger than 45 years of age. ? You are older than 45 years of age and your health care provider tells you that you are at risk for this type of infection. ? Your sexual activity has changed since you were last screened, and you are at increased risk for chlamydia or gonorrhea. Ask your health care provider if   you are at risk.  Ask your health care provider about whether you are at high risk for HIV. Your health care provider may recommend a prescription medicine to help prevent HIV infection. If you choose to take medicine to prevent HIV, you should first get tested for HIV. You should then be tested every 3 months for as long as you are taking the medicine. Pregnancy  If you are about to stop having your period (premenopausal) and  you may become pregnant, seek counseling before you get pregnant.  Take 400 to 800 micrograms (mcg) of folic acid every day if you become pregnant.  Ask for birth control (contraception) if you want to prevent pregnancy. Osteoporosis and menopause Osteoporosis is a disease in which the bones lose minerals and strength with aging. This can result in bone fractures. If you are 65 years old or older, or if you are at risk for osteoporosis and fractures, ask your health care provider if you should:  Be screened for bone loss.  Take a calcium or vitamin D supplement to lower your risk of fractures.  Be given hormone replacement therapy (HRT) to treat symptoms of menopause. Follow these instructions at home: Lifestyle  Do not use any products that contain nicotine or tobacco, such as cigarettes, e-cigarettes, and chewing tobacco. If you need help quitting, ask your health care provider.  Do not use street drugs.  Do not share needles.  Ask your health care provider for help if you need support or information about quitting drugs. Alcohol use  Do not drink alcohol if: ? Your health care provider tells you not to drink. ? You are pregnant, may be pregnant, or are planning to become pregnant.  If you drink alcohol: ? Limit how much you use to 0-1 drink a day. ? Limit intake if you are breastfeeding.  Be aware of how much alcohol is in your drink. In the [place] one drink equals one 12 oz bottle of beer (355 mL), one 5 oz glass of wine (148 mL), or one 1 oz glass of hard liquor (44 mL). General instructions  Schedule regular health, dental, and eye exams.  Stay current with your vaccines.  Tell your health care provider if: ? You often feel depressed. ? You have ever been abused or do not feel safe at home. Summary  Adopting a healthy lifestyle and getting preventive care are important in promoting health and wellness.  Follow your health care provider's instructions about healthy  diet, exercising, and getting tested or screened for diseases.  Follow your health care provider's instructions on monitoring your cholesterol and blood pressure. This information is not intended to replace advice given to you by your health care provider. Make sure you discuss any questions you have with your health care provider. Document Revised: 02/12/2018 Document Reviewed: 02/12/2018 Elsevier Patient Education  2020 [employer]  

## 2020-01-04 ENCOUNTER — Telehealth: Payer: Self-pay | Admitting: Family Medicine

## 2020-01-04 NOTE — Telephone Encounter (Signed)
If employer requires an actual date of MMR vaccine then we are unable to provide that. Her choices would be blood test to verify MMR immunity OR get MMR booster. OK to make appt to start Hep B series and get PPD placed.  Can also give MMR same day OR draw blood for MMR titer if that's what she chooses.  If she wants to know what I recommend then I recommend checking MMR titers to verify immunity rather than get MMR booster. -thx

## 2020-01-04 NOTE — Telephone Encounter (Signed)
No immunization history found of patient having hep B vaccine series or MMR in Epic or [facility]. Please advise, thanks.

## 2020-01-04 NOTE — Telephone Encounter (Signed)
Pt needs a MMR and Heb B vaccines if she is due and she needs a TB placed for employment with [employer]. If she is up to date with Heb B and MMR she just needs to have TB placed.

## 2020-01-05 ENCOUNTER — Ambulatory Visit (INDEPENDENT_AMBULATORY_CARE_PROVIDER_SITE_OTHER): Payer: 59

## 2020-01-05 ENCOUNTER — Other Ambulatory Visit: Payer: Self-pay

## 2020-01-05 DIAGNOSIS — Z23 Encounter for immunization: Secondary | ICD-10-CM

## 2020-01-05 NOTE — Telephone Encounter (Signed)
Patient advised and will be coming today for MMR booster, PPD placement and Hep B #1.

## 2020-01-07 ENCOUNTER — Other Ambulatory Visit: Payer: Self-pay

## 2020-01-07 ENCOUNTER — Ambulatory Visit: Payer: 59

## 2020-01-07 LAB — TB SKIN TEST
Induration: 0 mm
TB Skin Test: NEGATIVE

## 2020-06-14 ENCOUNTER — Telehealth: Payer: Self-pay

## 2020-06-14 NOTE — Telephone Encounter (Signed)
Spoke with patient regarding symptoms.  Patient currently using home remedies/recommendations from triage nurse. Declines appointment at this time. Will call back if symptoms worsen.   [facility] - Client TELEPHONE ADVICE RECORD [facility] Patient Name:Crystal Friedman Gender: Female DOB: 1976/01/07 Age: 45 Y 5 M 2 D Return Phone Number:[phone](Primary) Address: City/ State/ Zip: [place]  [address] Client [facility] - Client Client Site [facility] - Day Physician Santiago Bumpers - MD Contact Type Call Who Is Calling Patient / Member / Family / Caregiver Call Type Triage / Clinical Relationship To Patient Self Return Phone Number [phone] (Primary) Chief Complaint Sore Throat Reason for Call Symptomatic / Request for Health Information Initial Comment Caller has sore throat. Caller requesting strep test Translation No Nurse Assessment Nurse: Vear Clock, RN, Alcario Drought Date/Time Lamount Cohen Time): 06/14/2020 9:01:16 AM Confirm and document reason for call. If symptomatic, describe symptoms. ---Caller states she has a sore throat- symptom started on Sunday. No fever. No other symptoms. Drinking well. Does the patient have any new or worsening symptoms? ---Yes Will a triage be completed? ---Yes Related visit to physician within the last 2 weeks? ---No Does the PT have any chronic conditions? (i.e. diabetes, asthma, this includes High risk factors for pregnancy, etc.) ---No Is the patient pregnant or possibly pregnant? (Ask all females between the ages of 58-55) ---No Is this a behavioral health or substance abuse call? ---No Guidelines Guideline Title Affirmed Question Affirmed Notes Nurse Date/Time Lamount Cohen Time) Sore Throat SEVERE (e.g., excruciating) throat pain Vear Clock, RN, Alcario Drought 06/14/2020 9:04:00 AM Disp. Time Lamount Cohen Time) Disposition Final User 06/14/2020 9:07:24 AM See PCP within 24 Hours  Yes Vear Clock, RN, Alcario Drought PLEASE NOTE: All timestamps contained within this report are represented as [place] Standard Time. CONFIDENTIALTY NOTICE: This fax transmission is intended only for the addressee. It contains information that is legally privileged, confidential or otherwise protected from use or disclosure. If you are not the intended recipient, you are strictly prohibited from reviewing, disclosing, copying using or disseminating any of this information or taking any action in reliance on or regarding this information. If you have received this fax in error, please notify us immediately by telephone so that we can arrange for its return to [place]. Phone: [phone], Toll-Free: [phone], Fax: [phone] Page: 2 of 2 Call Id: [id number] Caller Disagree/Comply Comply Caller Understands Yes PreDisposition InappropriateToAsk Care Advice Given Per Guideline SEE PCP WITHIN 24 HOURS: * IF OFFICE WILL BE OPEN: You need to be examined within the next 24 hours. Call your doctor (or NP/PA) when the office opens and make an appointment. SORE THROAT: * Sip warm chicken broth or apple juice. * Suck on hard candy or an over-the-counter throat lozenge. * Gargle with warm salt water four times a day. To make salt water, put 1/2 teaspoon of salt in 8 oz (240 ml) of warm water. PAIN AND FEVER MEDICINES: * IBUPROFEN (E.G., MOTRIN, ADVIL): Take 400 mg (two 200 mg pills) by mouth every 6 hours. The most you should take each day is 1,200 mg (six 200 mg pills), unless your doctor has told you to take more. * Before taking any medicine, read all the instructions on the package. * Cold drinks, popsicles, and milk shakes are especially good. Avoid citrus fruits. SOFT DIET: * Eat a soft diet. DRINK PLENTY OF LIQUIDS: * Drink plenty of liquids. It is important to stay well-hydrated. CALL BACK IF: * You become worse CARE ADVICE  given per Sore Throat (Adult) guideline. Referrals REFERRED TO PCP OFFIC

## 2020-07-27 ENCOUNTER — Other Ambulatory Visit: Payer: Self-pay | Admitting: Family Medicine

## 2021-04-05 ENCOUNTER — Telehealth: Payer: Self-pay | Admitting: Family Medicine

## 2021-04-05 NOTE — Telephone Encounter (Signed)
I have received a request for surgical clearance for Crystal Friedman but I have not seen her since October 2021 so I cannot do this without seeing her first.  The form states that her surgery is ACL reconstruction scheduled for 04/06/2021.  ???

## 2021-04-06 NOTE — Telephone Encounter (Signed)
Noted  

## 2021-04-06 NOTE — Telephone Encounter (Signed)
Pt was advised unfortunately we would not be able to complete clearance without seeing her. We do not have any updated labs or notes to provide. She stated her surgery was still scheduled for 2:30 today.

## 2021-05-01 ENCOUNTER — Other Ambulatory Visit: Payer: Self-pay | Admitting: Family Medicine

## 2021-05-24 ENCOUNTER — Other Ambulatory Visit: Payer: Self-pay | Admitting: Family Medicine

## 2021-07-02 ENCOUNTER — Other Ambulatory Visit: Payer: Self-pay | Admitting: Family Medicine

## 2021-07-28 IMAGING — MG DIGITAL SCREENING BREAST BILAT IMPLANT W/ TOMO W/ CAD
8 of 12 series · 8 of 28 positions shown · non-contrast
Comparison: Previous exam(s).

CLINICAL DATA: Screening.

EXAM:
DIGITAL SCREENING BILATERAL MAMMOGRAM WITH IMPLANTS, CAD AND TOMO
The patient has retropectoral implants. Standard and implant
displaced views were performed.

[R MLO]
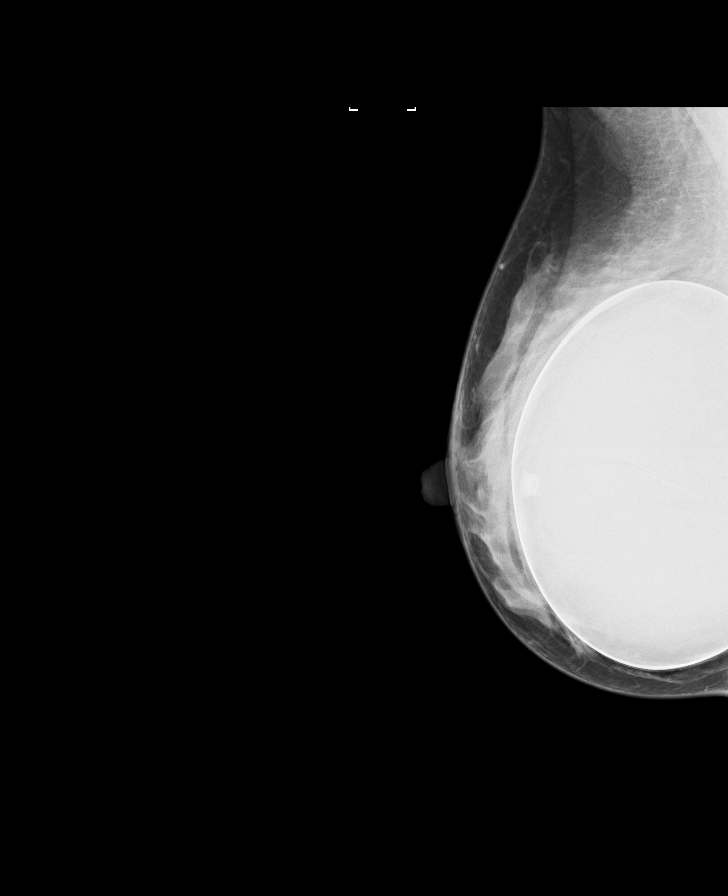

[R CC]
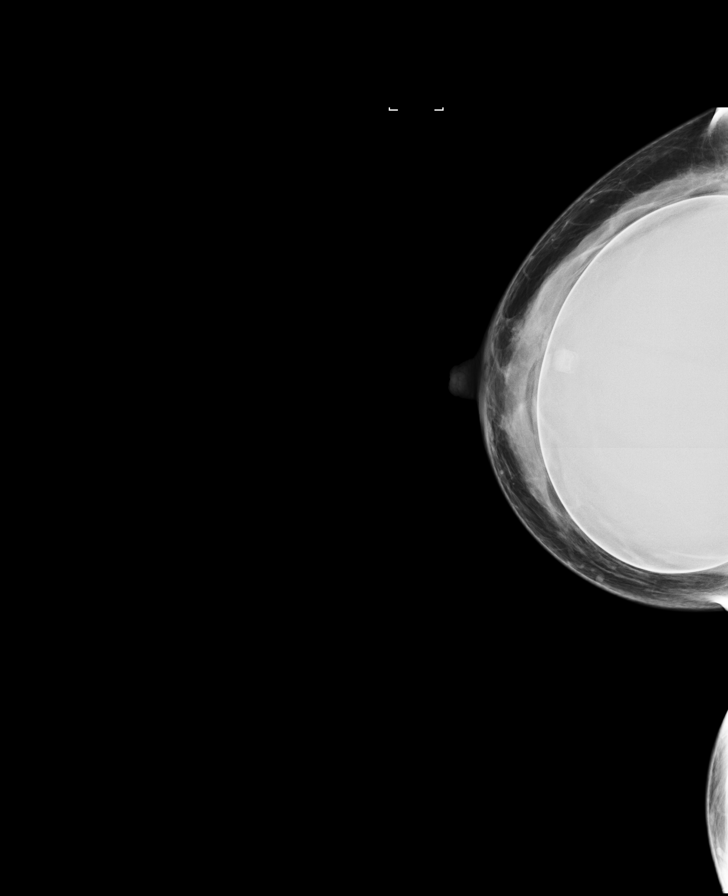

[L MLO]
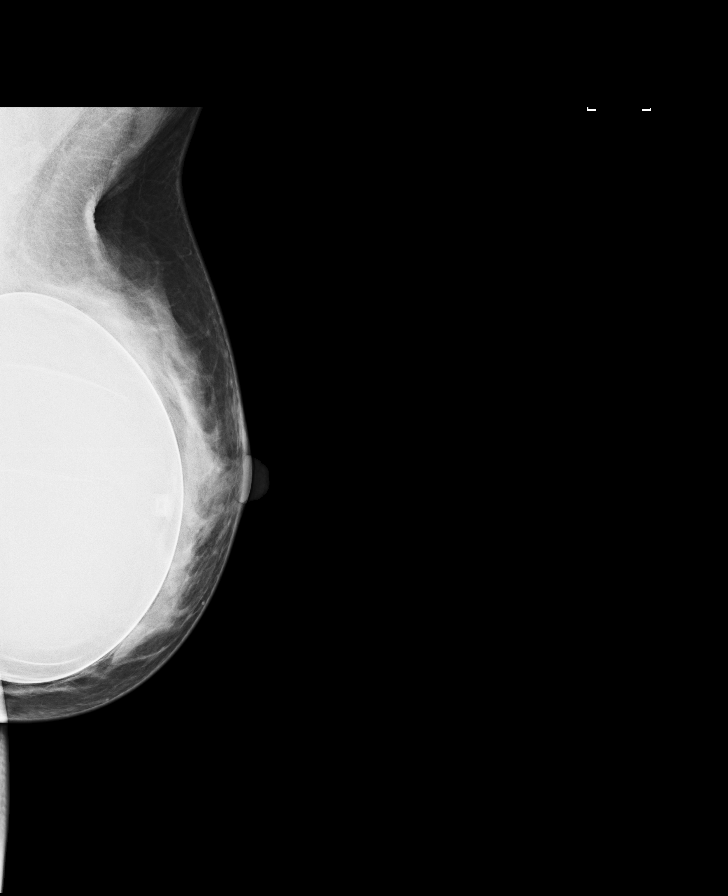

[L CC]
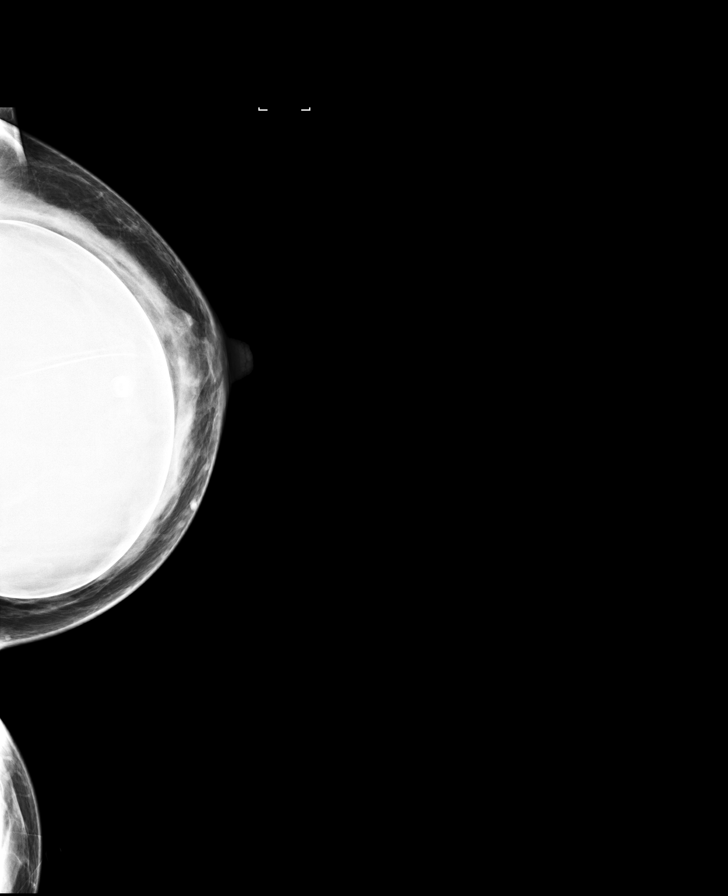

[L MLO synth-2D]
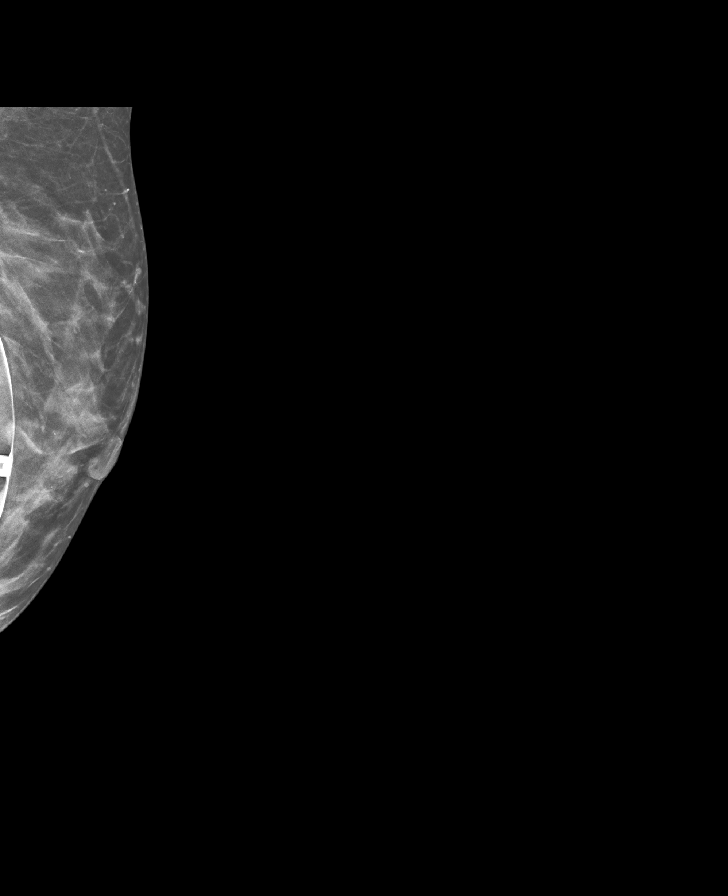

[R MLO synth-2D]
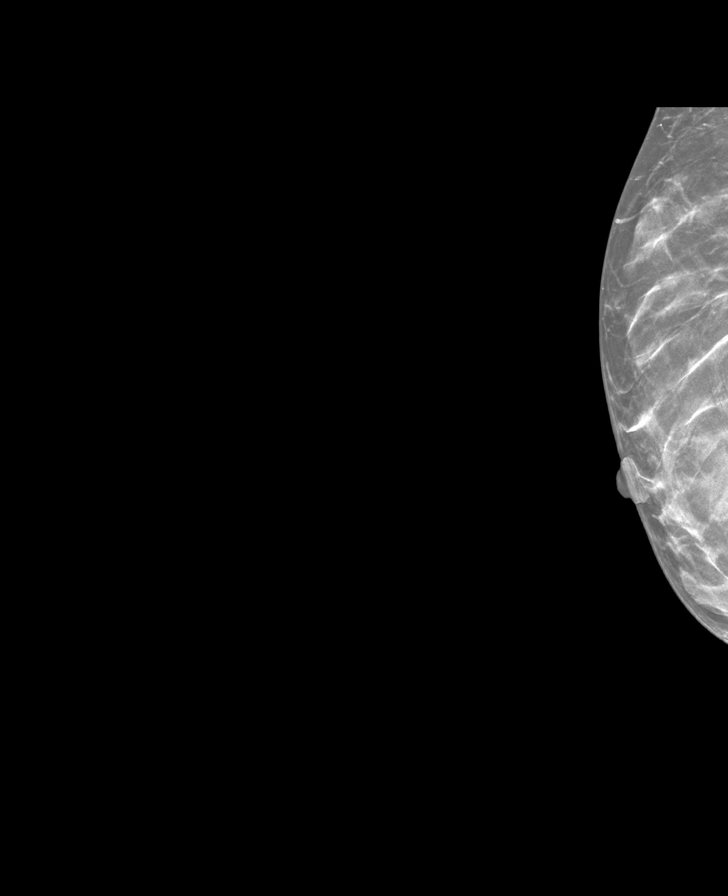

[R CC synth-2D]
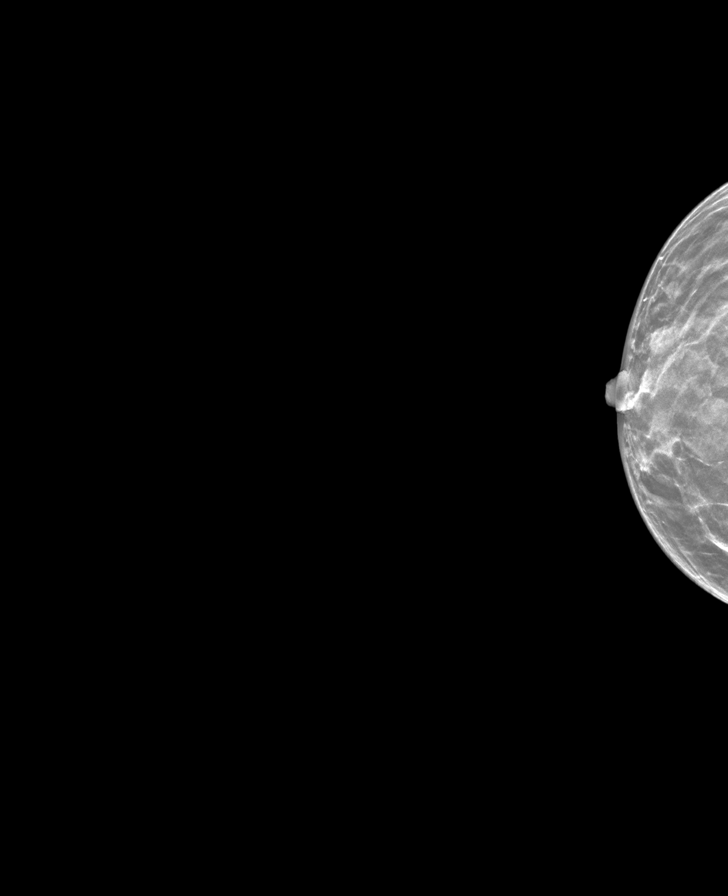

[L CC synth-2D]
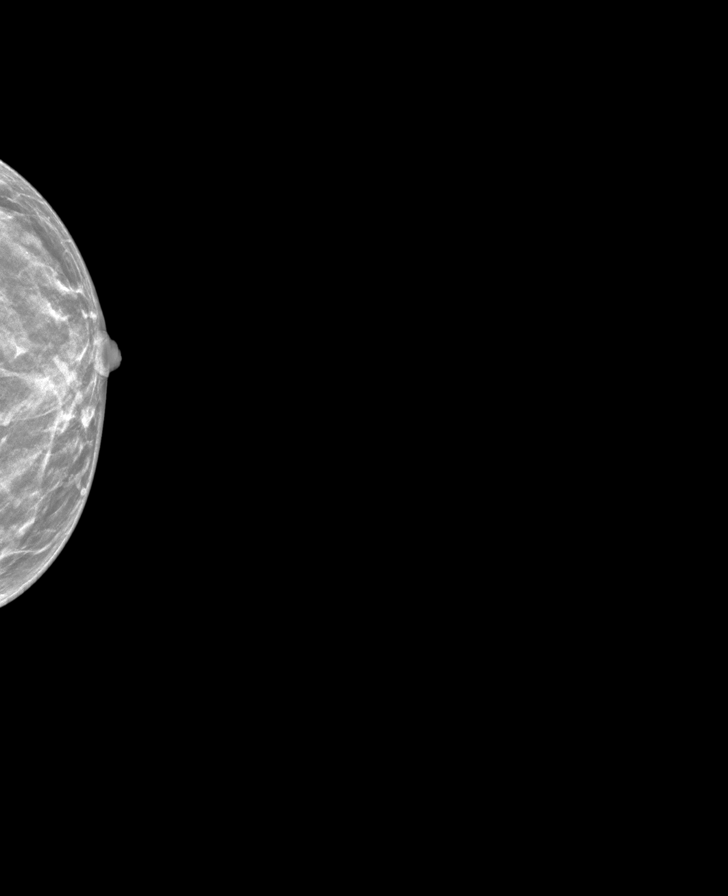

[8 of 28 positions shown; findings below may reference images not displayed]

ACR Breast Density Category c: The breast tissue is heterogeneously
dense, which may obscure small masses.
FINDINGS: There are no findings suspicious for malignancy. Images were
processed with CAD.
IMPRESSION: No mammographic evidence of malignancy. A result letter of this
screening mammogram will be mailed directly to the patient.

RECOMMENDATION:
Screening mammogram in one year. (Code:49-X-OQ9)

BI-RADS CATEGORY  1:  Negative.

## 2021-08-02 ENCOUNTER — Other Ambulatory Visit: Payer: Self-pay | Admitting: Family Medicine

## 2021-08-10 ENCOUNTER — Encounter: Payer: Self-pay | Admitting: Family Medicine

## 2021-08-10 ENCOUNTER — Ambulatory Visit (INDEPENDENT_AMBULATORY_CARE_PROVIDER_SITE_OTHER): Payer: 59 | Admitting: Family Medicine

## 2021-08-10 VITALS — BP 116/78 | HR 89 | Temp 97.9°F | Ht 61.0 in | Wt 118.4 lb

## 2021-08-10 DIAGNOSIS — L03012 Cellulitis of left finger: Secondary | ICD-10-CM | POA: Diagnosis not present

## 2021-08-10 MED ORDER — AMOXICILLIN-POT CLAVULANATE 875-125 MG PO TABS: 1.0000 | ORAL_TABLET | Freq: Two times a day (BID) | ORAL | 0 refills | Status: AC

## 2021-08-10 NOTE — Progress Notes (Signed)
OFFICE VISIT  08/10/2021  CC:  Chief Complaint  Patient presents with   Finger Infection    Left ring finger, 2-3 days.    Patient is a 46 y.o. female who presents for pain and swelling in the distal aspect of the left ring finger.  HPI: Onset of pain 2 to 3 days ago in the distal aspect of the left ring finger near the nail. Gradual enlargement, no pus drainage near the nail.  Similar problem on the nail in the past which required I&D.  She has been training her dogs lately and nicks the fingers fairly regularly.  No fever or malaise.   Past Medical History:  Diagnosis Date   Abnormal mammogram of right breast 01/03/2017   diagnostic mammo and u/s showed 2 benign-appearing lesions.  F/u diagnostic mammo and u/s of R breast 07/12/17 showed resolution of the 2 lesions.  Rpt mammo recommended 6 mo -->NORMAL 03/27/2018-->repeat 1 yr.   Anxiety    rare low dose prn ativan   Asthma    Hay fever    Paronychia of finger summer 2021   [facility] D at [facility]   SVT (supraventricular tachycardia) (HCC)    +tachypalpitations    Past Surgical History:  Procedure Laterality Date   AUGMENTATION MAMMAPLASTY Bilateral    Saline, Retropec    DILATION AND CURETTAGE OF UTERUS     TONSILLECTOMY      Outpatient Medications Prior to Visit  Medication Sig Dispense Refill   LORazepam (ATIVAN) 0.5 MG tablet Take 1 tablet (0.5 mg total) by mouth 2 (two) times daily as needed for anxiety. 60 tablet 1   metoprolol succinate (TOPROL-XL) 25 MG 24 hr tablet Take 2 tablets (50 mg total) by mouth daily. OFFICE VISIT NEEDED FOR FURTHER REFILLS 60 tablet 0   No facility-administered medications prior to visit.    No Known Allergies  ROS As per HPI  PE:    08/10/2021    4:22 PM 12/04/2019    9:59 AM 02/23/2019    2:54 PM  Vitals with BMI  Height 5\' 1"  5\' 1"    Weight 118 lbs 6 oz 117 lbs 10 oz 109 lbs  BMI 22.38 22.23   Systolic 116 124   Diastolic 78 83   Pulse 89 79      Physical Exam  Gen:  Alert, well appearing.  Patient is oriented to person, place, time, and situation. AFFECT: pleasant, lucid thought and speech. Left hand ring finger with focal swelling and erythema around the lateral nail fold.  Pulp of the finger without swelling, erythema, or tenderness.  LABS:  Last CBC Lab Results  Component Value Date   WBC 6.0 12/04/2019   HGB 13.1 12/04/2019   HCT 39.4 12/04/2019   MCV 94.0 12/04/2019   RDW 12.7 12/04/2019   PLT 229.0 12/04/2019   Last metabolic panel Lab Results  Component Value Date   GLUCOSE 76 12/04/2019   NA 139 12/04/2019   K 4.0 12/04/2019   CL 105 12/04/2019   CO2 27 12/04/2019   BUN 9 12/04/2019   CREATININE 0.66 12/04/2019   CALCIUM 9.1 12/04/2019   PROT 7.0 12/04/2019   ALBUMIN 4.5 12/04/2019   BILITOT 0.7 12/04/2019   ALKPHOS 61 12/04/2019   AST 17 12/04/2019   ALT 11 12/04/2019   IMPRESSION AND PLAN:  Paronychia, left ring finger. Augmentin 875 twice daily x10 days. Water soaks 20 minutes at least once a day.  An After Visit Summary was printed and  given to the patient.  FOLLOW UP: Return in about 4 months (around 12/10/2021) for annual CPE (fasting.  Signed:  Santiago Bumpers, MD           08/10/2021

## 2021-10-04 DIAGNOSIS — M179 Osteoarthritis of knee, unspecified: Secondary | ICD-10-CM | POA: Insufficient documentation

## 2022-04-09 ENCOUNTER — Telehealth: Payer: Self-pay

## 2022-04-09 NOTE — Telephone Encounter (Signed)
Transition Care Management Unsuccessful Follow-up Telephone Call  Date of discharge and from where:  [facility] 04/09/22   Attempts:  1st Attempt  Reason for unsuccessful TCM follow-up call:  Left voice message

## 2022-04-24 ENCOUNTER — Other Ambulatory Visit: Payer: Self-pay | Admitting: Family Medicine

## 2023-04-11 ENCOUNTER — Other Ambulatory Visit: Payer: Self-pay | Admitting: Obstetrics and Gynecology

## 2023-04-11 DIAGNOSIS — Z1231 Encounter for screening mammogram for malignant neoplasm of breast: Secondary | ICD-10-CM
